# Patient Record
Sex: Female | Born: 1985 | Race: Black or African American | Hispanic: No | Marital: Single | Smoking: Former smoker
Health system: Southern US, Community
[De-identification: ages and names within clinical notes are randomized; demographics above are authoritative.]

## PROBLEM LIST (undated history)

## (undated) DIAGNOSIS — G35 Multiple sclerosis: Secondary | ICD-10-CM

## (undated) HISTORY — PX: VAGINAL HYSTERECTOMY: SUR661

## (undated) HISTORY — DX: Multiple sclerosis: G35

---
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
## 2006-02-07 ENCOUNTER — Inpatient Hospital Stay: Payer: Self-pay

## 2007-02-10 ENCOUNTER — Emergency Department: Payer: Self-pay | Admitting: Emergency Medicine

## 2007-04-08 ENCOUNTER — Emergency Department: Payer: Self-pay | Admitting: Emergency Medicine

## 2007-05-24 ENCOUNTER — Emergency Department: Payer: Self-pay | Admitting: Emergency Medicine

## 2007-10-01 ENCOUNTER — Emergency Department: Payer: Self-pay | Admitting: Emergency Medicine

## 2007-12-11 ENCOUNTER — Emergency Department: Payer: Self-pay | Admitting: Emergency Medicine

## 2008-02-07 ENCOUNTER — Emergency Department: Payer: Self-pay | Admitting: Emergency Medicine

## 2008-02-09 ENCOUNTER — Emergency Department: Payer: Self-pay | Admitting: Emergency Medicine

## 2010-01-01 ENCOUNTER — Emergency Department: Payer: Self-pay | Admitting: Emergency Medicine

## 2010-08-05 ENCOUNTER — Emergency Department: Payer: Self-pay | Admitting: Emergency Medicine

## 2011-03-19 ENCOUNTER — Emergency Department: Payer: Self-pay | Admitting: Unknown Physician Specialty

## 2011-06-02 ENCOUNTER — Emergency Department: Payer: Self-pay | Admitting: Emergency Medicine

## 2011-08-20 ENCOUNTER — Emergency Department: Payer: Self-pay | Admitting: Internal Medicine

## 2011-08-29 ENCOUNTER — Emergency Department: Payer: Self-pay | Admitting: Emergency Medicine

## 2011-11-15 ENCOUNTER — Observation Stay: Payer: Self-pay | Admitting: Obstetrics and Gynecology

## 2011-11-15 LAB — COMPREHENSIVE METABOLIC PANEL
Albumin: 3 g/dL — ABNORMAL LOW (ref 3.4–5.0)
Alkaline Phosphatase: 97 U/L (ref 50–136)
Anion Gap: 14 (ref 7–16)
BUN: 5 mg/dL — ABNORMAL LOW (ref 7–18)
Bilirubin,Total: 0.3 mg/dL (ref 0.2–1.0)
Calcium, Total: 8.9 mg/dL (ref 8.5–10.1)
Chloride: 103 mmol/L (ref 98–107)
Co2: 22 mmol/L (ref 21–32)
Creatinine: 0.43 mg/dL — ABNORMAL LOW (ref 0.60–1.30)
EGFR (African American): >60
EGFR (Non-African Amer.): >60
Glucose: 88 mg/dL (ref 65–99)
Osmolality: 274 (ref 275–301)
Potassium: 3.8 mmol/L (ref 3.5–5.1)
SGOT(AST): 19 U/L (ref 15–37)
SGPT (ALT): 30 U/L
Sodium: 139 mmol/L (ref 136–145)
Total Protein: 7.4 g/dL (ref 6.4–8.2)

## 2011-11-15 LAB — CBC WITH DIFFERENTIAL/PLATELET
Basophil #: 0 10*3/uL (ref 0.0–0.1)
Basophil %: 0.4 %
Eosinophil #: 0.2 10*3/uL (ref 0.0–0.7)
Eosinophil %: 2 %
HCT: 33.6 % — ABNORMAL LOW (ref 35.0–47.0)
HGB: 10.6 g/dL — ABNORMAL LOW (ref 12.0–16.0)
Lymphocyte #: 2.1 10*3/uL (ref 1.0–3.6)
Lymphocyte %: 21.7 %
MCH: 23.5 pg — ABNORMAL LOW (ref 26.0–34.0)
MCHC: 31.6 g/dL — ABNORMAL LOW (ref 32.0–36.0)
MCV: 75 fL — ABNORMAL LOW (ref 80–100)
Monocyte #: 0.8 10*3/uL — ABNORMAL HIGH (ref 0.0–0.7)
Monocyte %: 8 %
Neutrophil #: 6.6 10*3/uL — ABNORMAL HIGH (ref 1.4–6.5)
Neutrophil %: 67.9 %
Platelet: 252 10*3/uL (ref 150–440)
RBC: 4.51 10*6/uL (ref 3.80–5.20)
RDW: 15.6 % — ABNORMAL HIGH (ref 11.5–14.5)
WBC: 9.8 10*3/uL (ref 3.6–11.0)

## 2011-11-15 LAB — LIPASE, BLOOD: Lipase: 75 U/L (ref 73–393)

## 2011-12-31 ENCOUNTER — Ambulatory Visit: Payer: Self-pay | Admitting: Physical Therapy

## 2011-12-31 ENCOUNTER — Encounter: Payer: Self-pay | Admitting: Occupational Therapy

## 2012-01-02 ENCOUNTER — Ambulatory Visit: Payer: Medicaid Other | Attending: Physical Medicine and Rehabilitation | Admitting: Occupational Therapy

## 2012-01-02 ENCOUNTER — Ambulatory Visit: Payer: Medicaid Other | Admitting: Physical Therapy

## 2012-01-02 ENCOUNTER — Ambulatory Visit: Payer: Medicaid Other | Admitting: Speech Pathology

## 2012-01-02 DIAGNOSIS — M6281 Muscle weakness (generalized): Secondary | ICD-10-CM | POA: Insufficient documentation

## 2012-01-02 DIAGNOSIS — M2420 Disorder of ligament, unspecified site: Secondary | ICD-10-CM | POA: Insufficient documentation

## 2012-01-02 DIAGNOSIS — M629 Disorder of muscle, unspecified: Secondary | ICD-10-CM | POA: Insufficient documentation

## 2012-01-02 DIAGNOSIS — IMO0001 Reserved for inherently not codable concepts without codable children: Secondary | ICD-10-CM | POA: Insufficient documentation

## 2012-01-15 ENCOUNTER — Ambulatory Visit: Payer: Medicaid Other | Admitting: Occupational Therapy

## 2012-01-22 ENCOUNTER — Encounter: Payer: Medicaid Other | Admitting: Occupational Therapy

## 2012-01-29 ENCOUNTER — Encounter: Payer: Medicaid Other | Admitting: Occupational Therapy

## 2012-11-10 ENCOUNTER — Emergency Department: Payer: Self-pay | Admitting: Emergency Medicine

## 2012-11-10 LAB — BASIC METABOLIC PANEL
Anion Gap: 8 (ref 7–16)
BUN: 13 mg/dL (ref 7–18)
Calcium, Total: 8.9 mg/dL (ref 8.5–10.1)
Chloride: 107 mmol/L (ref 98–107)
Co2: 21 mmol/L (ref 21–32)
Creatinine: 0.47 mg/dL — ABNORMAL LOW (ref 0.60–1.30)
EGFR (African American): >60
EGFR (Non-African Amer.): >60
Glucose: 89 mg/dL (ref 65–99)
Osmolality: 272 (ref 275–301)
Potassium: 3.9 mmol/L (ref 3.5–5.1)
Sodium: 136 mmol/L (ref 136–145)

## 2012-11-10 LAB — URINALYSIS, COMPLETE
Bacteria: NONE SEEN
Bilirubin,UR: NEGATIVE
Blood: NEGATIVE
Glucose,UR: NEGATIVE mg/dL (ref 0–75)
Ketone: NEGATIVE
Nitrite: NEGATIVE
Ph: 7 (ref 4.5–8.0)
Protein: NEGATIVE
RBC,UR: 2 /HPF (ref 0–5)
Specific Gravity: 1.012 (ref 1.003–1.030)
Squamous Epithelial: 3
WBC UR: 1 /HPF (ref 0–5)

## 2012-11-10 LAB — CBC
HCT: 35.9 % (ref 35.0–47.0)
HGB: 10.9 g/dL — ABNORMAL LOW (ref 12.0–16.0)
MCH: 20.4 pg — ABNORMAL LOW (ref 26.0–34.0)
MCHC: 30.3 g/dL — ABNORMAL LOW (ref 32.0–36.0)
MCV: 67 fL — ABNORMAL LOW (ref 80–100)
Platelet: 413 10*3/uL (ref 150–440)
RBC: 5.33 10*6/uL — ABNORMAL HIGH (ref 3.80–5.20)
RDW: 17.8 % — ABNORMAL HIGH (ref 11.5–14.5)
WBC: 8.7 10*3/uL (ref 3.6–11.0)

## 2013-01-08 ENCOUNTER — Emergency Department: Payer: Self-pay | Admitting: Emergency Medicine

## 2013-01-08 LAB — COMPREHENSIVE METABOLIC PANEL
Albumin: 3.6 g/dL (ref 3.4–5.0)
Alkaline Phosphatase: 150 U/L — ABNORMAL HIGH (ref 50–136)
Anion Gap: 10 (ref 7–16)
BUN: 12 mg/dL (ref 7–18)
Bilirubin,Total: 0.5 mg/dL (ref 0.2–1.0)
Calcium, Total: 8.6 mg/dL (ref 8.5–10.1)
Chloride: 104 mmol/L (ref 98–107)
Co2: 19 mmol/L — ABNORMAL LOW (ref 21–32)
Creatinine: 0.66 mg/dL (ref 0.60–1.30)
EGFR (African American): >60
EGFR (Non-African Amer.): >60
Glucose: 76 mg/dL (ref 65–99)
Osmolality: 265 (ref 275–301)
Potassium: 3.3 mmol/L — ABNORMAL LOW (ref 3.5–5.1)
SGOT(AST): 16 U/L (ref 15–37)
SGPT (ALT): 18 U/L (ref 12–78)
Sodium: 133 mmol/L — ABNORMAL LOW (ref 136–145)
Total Protein: 8 g/dL (ref 6.4–8.2)

## 2013-01-08 LAB — CBC WITH DIFFERENTIAL/PLATELET
Basophil #: 0 10*3/uL (ref 0.0–0.1)
Basophil %: 0.5 %
Eosinophil #: 0.1 10*3/uL (ref 0.0–0.7)
Eosinophil %: 1 %
HCT: 34.8 % — ABNORMAL LOW (ref 35.0–47.0)
HGB: 11 g/dL — ABNORMAL LOW (ref 12.0–16.0)
Lymphocyte #: 1.8 10*3/uL (ref 1.0–3.6)
Lymphocyte %: 29.6 %
MCH: 21 pg — ABNORMAL LOW (ref 26.0–34.0)
MCHC: 31.8 g/dL — ABNORMAL LOW (ref 32.0–36.0)
MCV: 66 fL — ABNORMAL LOW (ref 80–100)
Monocyte #: 0.3 x10 3/mm (ref 0.2–0.9)
Monocyte %: 5.2 %
Neutrophil #: 3.9 10*3/uL (ref 1.4–6.5)
Neutrophil %: 63.7 %
Platelet: 366 10*3/uL (ref 150–440)
RBC: 5.26 10*6/uL — ABNORMAL HIGH (ref 3.80–5.20)
RDW: 18.1 % — ABNORMAL HIGH (ref 11.5–14.5)
WBC: 6 10*3/uL (ref 3.6–11.0)

## 2013-01-08 LAB — LIPASE, BLOOD: Lipase: 60 U/L — ABNORMAL LOW (ref 73–393)

## 2013-02-18 ENCOUNTER — Emergency Department: Payer: Self-pay | Admitting: Emergency Medicine

## 2015-03-07 NOTE — H&P (Signed)
L&D Evaluation:  History:   HPI 29 yo G3P1011 at [redacted]w[redacted]d by LMP=10w [place] derived EDC of 03/25/2012 presenting with LUQ pain starting this evening.  Patient has baked Ziti and cake that was pretty greasy for dinner.  She has had problems with heartburn this pregnancy. Pain has been constant since starting, only relief is from slplinting affected area.  She denies cought, trauma.  Pain does not radiate.    Presents with abdominal pain    Patient's Medical History Depression, being followed for polyhydramnios at [facility]? Had thickened NT but negative amnio.    Medications Pre Natal Vitamins    Allergies NKDA    Social History tobacco    Family History Non-Contributory   Exam:   Vital Signs stable    General no apparent distress    Mental Status clear    Abdomen gravid with fundus at level of umbilicus, fndus non-tender, soft.  Mild LUQ pain, but actually feels better when pushing in    Edema no edema    FHT +FHT   Impression:   Impression GERD   Plan:   Comments - GI cocktail with viscous lidocaine - If improvement in symptoms will provide with Rx for Zantac - Has follow up [facility] 2/13  Addendum 11:08 Patient with no improvement after GI cocktail, now tearful.  Patient states that she feel like pain has actually worsened also some lumbago.   - CBC c dif - CMP - Lipase Still think that if not GERD likely MSK related.  Pain is not near insertion site of amniocentesis 2 weeks ago.  Vena Austria, MD    Follow Up Appointment need to schedule. in 1 week   Electronic Signatures: Lorrene Reid (MD)  (Signed 18-Jan-13 23:11)  Authored: L&D Evaluation   Last Updated: 18-Jan-13 23:11 by Lorrene Reid (MD)

## 2016-06-10 DIAGNOSIS — E611 Iron deficiency: Secondary | ICD-10-CM | POA: Insufficient documentation

## 2016-06-10 DIAGNOSIS — E559 Vitamin D deficiency, unspecified: Secondary | ICD-10-CM | POA: Insufficient documentation

## 2016-08-28 DIAGNOSIS — Z9884 Bariatric surgery status: Secondary | ICD-10-CM | POA: Insufficient documentation

## 2016-10-28 HISTORY — PX: LAPAROSCOPIC GASTRIC SLEEVE RESECTION: SHX5895

## 2017-10-01 DIAGNOSIS — N879 Dysplasia of cervix uteri, unspecified: Secondary | ICD-10-CM | POA: Insufficient documentation

## 2018-06-21 ENCOUNTER — Ambulatory Visit
Admission: EM | Admit: 2018-06-21 | Discharge: 2018-06-21 | Disposition: A | Discharge status: Home / Self Care | Payer: BC Managed Care – PPO | Attending: Family Medicine | Admitting: Family Medicine

## 2018-06-21 DIAGNOSIS — G35 Multiple sclerosis: Secondary | ICD-10-CM | POA: Insufficient documentation

## 2018-06-21 DIAGNOSIS — H5789 Other specified disorders of eye and adnexa: Secondary | ICD-10-CM | POA: Diagnosis not present

## 2018-06-21 MED ORDER — KETOROLAC TROMETHAMINE 0.5 % OP SOLN: 1.0000 [drp] | Freq: Four times a day (QID) | OPHTHALMIC | 0 refills | Status: DC | PRN

## 2018-06-21 NOTE — ED Provider Notes (Signed)
[facility]    CSN: [id number] Arrival date & time: 06/21/18  1122  History   Chief Complaint Chief Complaint  Patient presents with  . Eye Problem   HPI  32 year old female presents with an eye problem.  Patient reports that approximately 2 hours prior to arrival here she developed redness, irritation, and mild pain of her right eye.  She took her contact out and the redness and irritation has improved.  However, she is concerned about a foreign body in her right eye.  She does not recall a foreign body getting into her eye.  No vision changes.  Her vision is stable currently.  No severe pain.  No photophobia.  No drainage.  No other associated symptoms.  No other complaints.  PMH, Surgical Hx, Family Hx, Social History reviewed and updated as below.  PMH: Multiple sclerosis, morbid obesity, iron deficiency  Surgical history: CESAREAN SECTION      PR LAP, GAST RESTRICT PROC, LONGITUDINAL GASTRECTOMY 08/28/2016 N/A Procedure: LAPAROSCOPY, SURGICAL, GASTRIC RESTRICTIVE PROCEDURE; LONGITUDINAL GASTRECTOMY; Surgeon: Felton Clinton, MD; Location: MAIN OR [facility]; Service: Gastrointestinal    Family Hx: Hypertension Father    Diabetes Maternal Grandmother    Hypertension Maternal Grandmother    Hypertension Mother    Heart disease Paternal Grandmother     Social History Social History   Tobacco Use  . Smoking status: Current Some Day Smoker  . Smokeless tobacco: Never Used  Substance Use Topics  . Alcohol use: Yes  . Drug use: Not on file   Allergies   Penicillins   Review of Systems Review of Systems  Constitutional: Negative.   Eyes: Positive for pain and redness. Negative for visual disturbance.   Physical Exam Triage Vital Signs ED Triage Vitals  Enc Vitals Group     BP 06/21/18 1135 (!) 157/100     Pulse Rate 06/21/18 1135 78     Resp 06/21/18 1135 18     Temp 06/21/18 1135 98.2 F (36.8 C)     Temp Source 06/21/18 1135 Oral       SpO2 06/21/18 1135 100 %     Weight 06/21/18 1137 213 lb (96.6 kg)     Height --      Head Circumference --      Peak Flow --      Pain Score 06/21/18 1136 0     Pain Loc --      Pain Edu? --      Excl. in GC? --    Updated Vital Signs BP (!) 157/100 (BP Location: Right Arm)   Pulse 78   Temp 98.2 F (36.8 C) (Oral)   Resp 18   Wt 96.6 kg   LMP 06/17/2018 (Exact Date)   SpO2 100%   Visual Acuity Right Eye Distance: 20/25 Left Eye Distance: 20/25 Bilateral Distance:    Right Eye Near:   Left Eye Near:    Bilateral Near:     Physical Exam  Constitutional: She is oriented to person, place, and time. She appears well-developed. No distress.  HENT:  Head: Normocephalic and atraumatic.  Eyes: Pupils are equal, round, and reactive to light. Conjunctivae are normal. Right eye exhibits no discharge. Left eye exhibits no discharge.  No fluorescein uptake.  No evidence of foreign body.  Pulmonary/Chest: Effort normal. No respiratory distress.  Neurological: She is alert and oriented to person, place, and time.  Psychiatric: She has a normal mood and affect. Her behavior is normal.  Nursing note  and vitals reviewed.  UC Treatments / Results  Labs (all labs ordered are listed, but only abnormal results are displayed) Labs Reviewed - No data to display  EKG None  Radiology No results found.  Procedures Procedures (including critical care time)  Medications Ordered in UC Medications - No data to display  Initial Impression / Assessment and Plan / UC Course  I have reviewed the triage vital signs and the nursing notes.  Pertinent labs & imaging results that were available during my care of the patient were reviewed by me and considered in my medical decision making (see chart for details).    32 year old female presents with eye irritation.  Her exam is unrevealing.  Advised to stop wearing contacts the next few days.  Ketorolac eyedrops as needed.  Supportive  care.  Final Clinical Impressions(s) / UC Diagnoses   Final diagnoses:  Eye irritation     Discharge Instructions     No contacts for a few days.  Use the eyedrops as needed.  Take care  Dr. Adriana Simas   ED Prescriptions    Medication Sig Dispense Auth. Provider   ketorolac (ACULAR) 0.5 % ophthalmic solution Place 1 drop into the right eye 4 (four) times daily as needed. 3 mL Tommie Sams, DO     Controlled Substance Prescriptions [place] Controlled Substance Registry consulted? Not Applicable   Tommie Sams, DO 06/21/18 1324

## 2018-06-21 NOTE — ED Triage Notes (Signed)
Pt states she feels as if something is in her right eye, possibly a piece of contact. Blurry vision and watery. Was red but states that went away. It started today.

## 2018-06-21 NOTE — Discharge Instructions (Signed)
No contacts for a few days.  Use the eyedrops as needed.  Take care  Dr. Adriana Simas

## 2018-10-30 ENCOUNTER — Other Ambulatory Visit: Payer: Self-pay

## 2018-10-30 ENCOUNTER — Ambulatory Visit
Admission: EM | Admit: 2018-10-30 | Discharge: 2018-10-30 | Disposition: A | Discharge status: Home / Self Care | Payer: BC Managed Care – PPO | Attending: Physician Assistant | Admitting: Physician Assistant

## 2018-10-30 DIAGNOSIS — J020 Streptococcal pharyngitis: Secondary | ICD-10-CM

## 2018-10-30 LAB — RAPID STREP SCREEN (MED CTR MEBANE ONLY): STREPTOCOCCUS, GROUP A SCREEN (DIRECT): POSITIVE — AB

## 2018-10-30 MED ORDER — AZITHROMYCIN 500 MG PO TABS: 500.0000 mg | ORAL_TABLET | Freq: Every day | ORAL | 0 refills | Status: AC

## 2018-10-30 NOTE — ED Triage Notes (Signed)
Patient states that symptoms started on Sunday with what feels like she had something in her throat. Patient states that symptoms have been worsening with painful swallowing and pain in right ear.

## 2018-10-30 NOTE — ED Provider Notes (Signed)
[facility]    CSN: [id number] Arrival date & time: 10/30/18  0930     History   Chief Complaint Chief Complaint  Patient presents with  . Sore Throat    APPT    HPI Jamie Meyers is a 33 y.o. female.   Patient is a 33 year old female who presents complaint of sore throat since Sunday (today is Friday).  Patient denies any fever, chills, shortness of breath, chest pain, abdominal pain, nausea, vomiting.  Patient does report some pain to her right ear with swallowing.  She also reports some nasal congestion but that is normal for her.  Patient reports allergies to penicillin.     History reviewed. No pertinent past medical history.  Patient Active Problem List   Diagnosis Date Noted  . Multiple sclerosis (HCC) 06/21/2018  . 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 Morbid obesity due to excess calories (HCC) 08/05/2016  . Iron deficiency 06/10/2016  . Vitamin D deficiency 06/10/2016    Past Surgical History:  Procedure Laterality Date  . CESAREAN SECTION  2013  . LAPAROSCOPIC GASTRIC SLEEVE RESECTION  2018    OB History   No obstetric history on file.      Home Medications    Prior to Admission medications   Medication Sig Start Date End Date Taking? Authorizing Provider  TRI-SPRINTEC 0.18/0.215/0.25 MG-35 MCG tablet Take 1 tablet by mouth daily. 05/01/18  Yes [provider]  azithromycin (ZITHROMAX) 500 MG tablet Take 1 tablet (500 mg total) by mouth daily for 5 days. 10/30/18 11/04/18  Candis Schatz, PA-C    Family History Family History  Problem Relation Age of Onset  . Hypertension Mother   . Hypertension Father   . CVA Father   . Heart attack Father     Social History Social History   Tobacco Use  . Smoking status: Former Games developer  . Smokeless tobacco: Never Used  Substance Use Topics  . Alcohol use: Yes    Comment: occasionally  . Drug use: Not Currently     Allergies     Penicillins   Review of Systems Review of Systems as noted above in HPI.  Other systems reviewed and found to be negative.   Physical Exam Triage Vital Signs ED Triage Vitals  Enc Vitals Group     BP 10/30/18 0943 (!) 130/92     Pulse Rate 10/30/18 0943 83     Resp 10/30/18 0943 18     Temp 10/30/18 0943 98.7 F (37.1 C)     Temp Source 10/30/18 0943 Oral     SpO2 10/30/18 0943 100 %     Weight 10/30/18 0939 215 lb (97.5 kg)     Height 10/30/18 0939 5\' 5"  (1.651 m)     Head Circumference --      Peak Flow --      Pain Score 10/30/18 0938 7     Pain Loc --      Pain Edu? --      Excl. in GC? --    No data found.  Updated Vital Signs BP (!) 130/92 (BP Location: Left Arm)   Pulse 83   Temp 98.7 F (37.1 C) (Oral)   Resp 18   Ht 5\' 5"  (1.651 m)   Wt 215 lb (97.5 kg)   LMP 10/04/2018   SpO2 100%   BMI 35.78 kg/m    Physical Exam Constitutional:      Appearance: She  is well-developed. She is not ill-appearing.  HENT:     Right Ear: A middle ear effusion is present.     Left Ear: A middle ear effusion is present.     Nose: Congestion present.     Mouth/Throat:     Mouth: Mucous membranes are moist.     Pharynx: Posterior oropharyngeal erythema present.     Tonsils: Swelling: 1+ on the right. 1+ on the left.  Eyes:     Conjunctiva/sclera: Conjunctivae normal.     Pupils: Pupils are equal, round, and reactive to light.  Neck:     Musculoskeletal: Normal range of motion and neck supple.  Cardiovascular:     Rate and Rhythm: Normal rate and regular rhythm.     Heart sounds: Normal heart sounds. No murmur. No gallop.   Pulmonary:     Effort: Pulmonary effort is normal.     Breath sounds: Normal breath sounds.  Abdominal:     General: Bowel sounds are normal.     Palpations: Abdomen is soft.  Skin:    General: Skin is warm and dry.     Capillary Refill: Capillary refill takes less than 2 seconds.  Neurological:     General: No focal deficit present.      Mental Status: She is alert and oriented to person, place, and time.  Psychiatric:        Mood and Affect: Mood normal.        Behavior: Behavior normal.      UC Treatments / Results  Labs (all labs ordered are listed, but only abnormal results are displayed) Labs Reviewed  RAPID STREP SCREEN ([facility] ONLY) - Abnormal; Notable for the following components:      Result Value   Streptococcus, Group A Screen (Direct) POSITIVE (*)    All other components within normal limits    EKG None  Radiology No results found.  Procedures Procedures (including critical care time)  Medications Ordered in UC Medications - No data to display  Initial Impression / Assessment and Plan / UC Course  I have reviewed the triage vital signs and the nursing notes.  Pertinent labs & imaging results that were available during my care of the patient were reviewed by me and considered in my medical decision making (see chart for details).     Patient with reported sore throat for about 4 days.  Fluid to both ears.  Rapid strep was positive.  Patient given prescription for azithromycin, 500 mg x 5 days due to her penicillin allergy.  Patient given instructions as noted below.  Patient verbalized understanding is agreement with plan.  Final Clinical Impressions(s) / UC Diagnoses   Final diagnoses:  Strep throat     Discharge Instructions     -Azithromycin: one tablet daily for 5 days -ibuprofen and Tylenol as needed for pain and fever -sore throat spray or lozenge as needed -follow up with PCP if no improvement   ED Prescriptions    Medication Sig Dispense Auth. Provider   azithromycin (ZITHROMAX) 500 MG tablet Take 1 tablet (500 mg total) by mouth daily for 5 days. 5 tablet Candis SchatzHarris, Marlaya Turck D, PA-C     Controlled Substance Prescriptions [place] Controlled Substance Registry consulted? Not Applicable   Candis SchatzHarris, Steward Sames D, PA-C 10/30/18 1628

## 2018-10-30 NOTE — Discharge Instructions (Signed)
-  Azithromycin: one tablet daily for 5 days -ibuprofen and Tylenol as needed for pain and fever -sore throat spray or lozenge as needed -follow up with PCP if no improvement

## 2020-01-10 ENCOUNTER — Emergency Department: Payer: BC Managed Care – PPO

## 2020-01-10 ENCOUNTER — Emergency Department
Admission: EM | Admit: 2020-01-10 | Discharge: 2020-01-10 | Disposition: A | Discharge status: Home / Self Care | Payer: BC Managed Care – PPO | Attending: Emergency Medicine | Admitting: Emergency Medicine

## 2020-01-10 ENCOUNTER — Other Ambulatory Visit: Payer: Self-pay

## 2020-01-10 ENCOUNTER — Encounter: Payer: Self-pay | Admitting: Emergency Medicine

## 2020-01-10 DIAGNOSIS — R0789 Other chest pain: Secondary | ICD-10-CM | POA: Insufficient documentation

## 2020-01-10 DIAGNOSIS — R0602 Shortness of breath: Secondary | ICD-10-CM | POA: Insufficient documentation

## 2020-01-10 DIAGNOSIS — Y9389 Activity, other specified: Secondary | ICD-10-CM | POA: Diagnosis not present

## 2020-01-10 DIAGNOSIS — S161XXA Strain of muscle, fascia and tendon at neck level, initial encounter: Secondary | ICD-10-CM | POA: Insufficient documentation

## 2020-01-10 DIAGNOSIS — Y929 Unspecified place or not applicable: Secondary | ICD-10-CM | POA: Insufficient documentation

## 2020-01-10 DIAGNOSIS — Z87891 Personal history of nicotine dependence: Secondary | ICD-10-CM | POA: Insufficient documentation

## 2020-01-10 DIAGNOSIS — X500XXA Overexertion from strenuous movement or load, initial encounter: Secondary | ICD-10-CM | POA: Insufficient documentation

## 2020-01-10 DIAGNOSIS — Z793 Long term (current) use of hormonal contraceptives: Secondary | ICD-10-CM | POA: Diagnosis not present

## 2020-01-10 DIAGNOSIS — Y999 Unspecified external cause status: Secondary | ICD-10-CM | POA: Insufficient documentation

## 2020-01-10 DIAGNOSIS — S199XXA Unspecified injury of neck, initial encounter: Secondary | ICD-10-CM | POA: Diagnosis present

## 2020-01-10 DIAGNOSIS — M542 Cervicalgia: Secondary | ICD-10-CM

## 2020-01-10 LAB — BASIC METABOLIC PANEL
Anion gap: 10 (ref 5–15)
BUN: 12 mg/dL (ref 6–20)
CO2: 22 mmol/L (ref 22–32)
Calcium: 9.1 mg/dL (ref 8.9–10.3)
Chloride: 104 mmol/L (ref 98–111)
Creatinine, Ser: 0.58 mg/dL (ref 0.44–1.00)
GFR calc Af Amer: >60 mL/min (ref >60)
GFR calc non Af Amer: >60 mL/min (ref >60)
Glucose, Bld: 97 mg/dL (ref 70–99)
Potassium: 3.7 mmol/L (ref 3.5–5.1)
Sodium: 136 mmol/L (ref 135–145)

## 2020-01-10 LAB — CBC
HCT: 40 % (ref 36.0–46.0)
Hemoglobin: 12.1 g/dL (ref 12.0–15.0)
MCH: 22.4 pg — ABNORMAL LOW (ref 26.0–34.0)
MCHC: 30.3 g/dL (ref 30.0–36.0)
MCV: 74.1 fL — ABNORMAL LOW (ref 80.0–100.0)
Platelets: 365 10*3/uL (ref 150–400)
RBC: 5.4 MIL/uL — ABNORMAL HIGH (ref 3.87–5.11)
RDW: 17.2 % — ABNORMAL HIGH (ref 11.5–15.5)
WBC: 8.7 10*3/uL (ref 4.0–10.5)
nRBC: 0 % (ref 0.0–0.2)

## 2020-01-10 LAB — TROPONIN I (HIGH SENSITIVITY)
Troponin I (High Sensitivity): <2 ng/L (ref <18)
Troponin I (High Sensitivity): <2 ng/L (ref <18)

## 2020-01-10 MED ORDER — IOHEXOL 350 MG/ML SOLN
75.0000 mL | Freq: Once | INTRAVENOUS | Status: AC | PRN
  Administered 2020-01-10: 75 mL via INTRAVENOUS

## 2020-01-10 MED ORDER — ONDANSETRON HCL 4 MG/2ML IJ SOLN
4.0000 mg | Freq: Once | INTRAMUSCULAR | Status: AC
  Administered 2020-01-10: 4 mg via INTRAVENOUS
  Filled 2020-01-10: qty 2

## 2020-01-10 MED ORDER — MORPHINE SULFATE (PF) 4 MG/ML IV SOLN
6.0000 mg | Freq: Once | INTRAVENOUS | Status: AC
  Administered 2020-01-10: 6 mg via INTRAVENOUS
  Filled 2020-01-10: qty 2

## 2020-01-10 MED ORDER — KETOROLAC TROMETHAMINE 30 MG/ML IJ SOLN
15.0000 mg | Freq: Once | INTRAMUSCULAR | Status: AC
  Administered 2020-01-10: 15 mg via INTRAVENOUS
  Filled 2020-01-10: qty 1

## 2020-01-10 MED ORDER — NAPROXEN 375 MG PO TABS: 375.0000 mg | ORAL_TABLET | Freq: Two times a day (BID) | ORAL | 0 refills | Status: AC

## 2020-01-10 MED ORDER — DEXAMETHASONE SODIUM PHOSPHATE 10 MG/ML IJ SOLN
10.0000 mg | Freq: Once | INTRAMUSCULAR | Status: AC
  Administered 2020-01-10: 10 mg via INTRAVENOUS
  Filled 2020-01-10: qty 1

## 2020-01-10 MED ORDER — HYDROCODONE-ACETAMINOPHEN 5-325 MG PO TABS: 1.0000 | ORAL_TABLET | Freq: Four times a day (QID) | ORAL | 0 refills | Status: AC | PRN

## 2020-01-10 NOTE — ED Triage Notes (Signed)
Pt arrival via [facility] from home via POV. Pt states she left urgent care and they sent her here for high BP.   Pt states two weeks ago she had a shouting match with someone and that she woke up the next day with her throat sore. Pt states 'I felt like I had had been choked' but that it went away and now there is a throbbing in her throat that has led to chest pain and shob.   Pt states she hasn't had any fevers, cough, weakness, n/v/d, or other symptoms.  Pt a&ox4

## 2020-01-10 NOTE — ED Notes (Addendum)
Pt reports "throbbing" pain from R side of neck down into R side of chest. Reports SOB at rest. Resp currently regular/unlabored. Denies fever/cough/N/V/D/difficulty swallowing. Currently NSR on monitor at 79.

## 2020-01-10 NOTE — ED Notes (Signed)
EDP Isaacs at bedside 

## 2020-01-10 NOTE — ED Notes (Signed)
Pt given crackers and water to PO challenge per EDP Isaacs verbal order.

## 2020-01-10 NOTE — ED Notes (Signed)
Pt urinated. Verbal from EDP Isaacs that pt does not need urine preg.

## 2020-01-10 NOTE — ED Provider Notes (Signed)
[facility] Emergency Department Provider Note  ____________________________________________   First MD Initiated Contact with Patient 01/10/20 1523     (approximate)  I have reviewed the triage vital signs and the nursing notes.   HISTORY  Chief Complaint Chest Pain and Shortness of Breath    HPI Jamie Meyers is a 34 y.o. female  Here with neck pain. Pt reports that she was involved in a loud shouting episode last week. Several days later, she began to develop a sharp, stabbing sensation in her right lower neck that is worse with movement, swallowing, and yelling. She's had some radiation of this down toward her chest. No fever, chills. No cough or SOB. No trauma to the neck. No associated HA, weakness, numbness, or visual changes. No other complaints. No specific alleviating factors.       History reviewed. No pertinent past medical history.  Patient Active Problem List   Diagnosis Date Noted  . Multiple sclerosis (HCC) 06/21/2018  . 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 Morbid obesity due to excess calories (HCC) 08/05/2016  . Iron deficiency 06/10/2016  . Vitamin D deficiency 06/10/2016    Past Surgical History:  Procedure Laterality Date  . CESAREAN SECTION  2013  . LAPAROSCOPIC GASTRIC SLEEVE RESECTION  2018    Prior to Admission medications   Medication Sig Start Date End Date Taking? Authorizing Provider  HYDROcodone-acetaminophen (NORCO/VICODIN) 5-325 MG tablet Take 1-2 tablets by mouth every 6 (six) hours as needed for moderate pain or severe pain. 01/10/20 01/09/21  Shaune Pollack, MD  naproxen (NAPROSYN) 375 MG tablet Take 1 tablet (375 mg total) by mouth 2 (two) times daily with a meal for 5 days. 01/10/20 01/15/20  Shaune Pollack, MD  TRI-SPRINTEC 0.18/0.215/0.25 MG-35 MCG tablet Take 1 tablet by mouth daily. 05/01/18   [provider]    Allergies Penicillins  Family  History  Problem Relation Age of Onset  . Hypertension Mother   . Hypertension Father   . CVA Father   . Heart attack Father     Social History Social History   Tobacco Use  . Smoking status: Former Games developer  . Smokeless tobacco: Never Used  Substance Use Topics  . Alcohol use: Yes    Comment: occasionally  . Drug use: Not Currently    Review of Systems  Review of Systems  Constitutional: Negative for fatigue and fever.  HENT: Positive for sore throat and trouble swallowing. Negative for congestion.   Eyes: Negative for visual disturbance.  Respiratory: Negative for cough and shortness of breath.   Cardiovascular: Negative for chest pain.  Gastrointestinal: Negative for abdominal pain, diarrhea, nausea and vomiting.  Genitourinary: Negative for flank pain.  Musculoskeletal: Positive for neck pain. Negative for back pain.  Skin: Negative for rash and wound.  Neurological: Negative for weakness.  All other systems reviewed and are negative.    ____________________________________________  PHYSICAL EXAM:      VITAL SIGNS: ED Triage Vitals  Enc Vitals Group     BP 01/10/20 1330 (!) 148/86     Pulse Rate 01/10/20 1330 87     Resp 01/10/20 1330 18     Temp 01/10/20 1330 98.4 F (36.9 C)     Temp src --      SpO2 01/10/20 1330 100 %     Weight 01/10/20 1328 241 lb (109.3 kg)     Height 01/10/20 1328 5\' 5"  (1.651 m)  Head Circumference --      Peak Flow --      Pain Score 01/10/20 1328 8     Pain Loc --      Pain Edu? --      Excl. in [place]? --      Physical Exam Vitals and nursing note reviewed.  Constitutional:      General: She is not in acute distress.    Appearance: She is well-developed.  HENT:     Head: Normocephalic and atraumatic.  Eyes:     Conjunctiva/sclera: Conjunctivae normal.  Neck:     Comments: Moderate TTP over base of right anterior neck, overlying carotid and jugular, without apparent crepitance, deformity, bruit, hematoma, or  appreciable abnormality. No LAD. Neck ROM is full and painless. No hoarseness. Cardiovascular:     Rate and Rhythm: Normal rate and regular rhythm.     Heart sounds: Normal heart sounds. No murmur. No friction rub.  Pulmonary:     Effort: Pulmonary effort is normal. No respiratory distress.     Breath sounds: Normal breath sounds. No wheezing or rales.  Abdominal:     General: There is no distension.     Palpations: Abdomen is soft.     Tenderness: There is no abdominal tenderness.  Musculoskeletal:     Cervical back: Neck supple.  Skin:    General: Skin is warm.     Capillary Refill: Capillary refill takes less than 2 seconds.  Neurological:     Mental Status: She is alert and oriented to person, place, and time.     Motor: No abnormal muscle tone.       ____________________________________________   LABS (all labs ordered are listed, but only abnormal results are displayed)  Labs Reviewed  CBC - Abnormal; Notable for the following components:      Result Value   RBC 5.40 (*)    MCV 74.1 (*)    MCH 22.4 (*)    RDW 17.2 (*)    All other components within normal limits  BASIC METABOLIC PANEL  TROPONIN I (HIGH SENSITIVITY)  TROPONIN I (HIGH SENSITIVITY)    ____________________________________________  EKG: Normal SR, VR 90. ________________________________________  RADIOLOGY All imaging, including plain films, CT scans, and ultrasounds, independently reviewed by me, and interpretations confirmed via formal radiology reads.  ED MD interpretation:   CXR: Clear CTA Neck: no acute abnormality, arteries without abnormality  Official radiology report(s): DG Chest 2 View  Result Date: 01/10/2020 CLINICAL DATA:  Chest pain EXAM: CHEST - 2 VIEW COMPARISON:  April 09, 2007. FINDINGS: The lungs are clear. The heart size and pulmonary vascularity are normal. No adenopathy. No pneumothorax. No bone lesions. IMPRESSION: No abnormality noted. Electronically Signed   By: Lowella Grip III M.D.   On: 01/10/2020 14:33   CT Angio Neck W and/or Wo Contrast  Result Date: 01/10/2020 CLINICAL DATA:  Neck trauma, arterial injury suspected. Additional history provided: Right chest pain and right neck pain after a "screaming match" 2 weeks ago, history of hypertension. EXAM: CT ANGIOGRAPHY NECK TECHNIQUE: Multidetector CT imaging of the neck was performed using the standard protocol during bolus administration of intravenous contrast. Multiplanar CT image reconstructions and MIPs were obtained to evaluate the vascular anatomy. Carotid stenosis measurements (when applicable) are obtained utilizing NASCET criteria, using the distal internal carotid diameter as the denominator. CONTRAST:  65mL OMNIPAQUE IOHEXOL 350 MG/ML SOLN COMPARISON:  No pertinent prior studies available for comparison. FINDINGS: Aortic arch: Standard aortic branching. The  visualized portions of the aortic arch are unremarkable. No innominate or proximal subclavian artery stenosis. Right carotid system: CCA and ICA smooth and patent within the neck without stenosis. Left carotid system: CCA and ICA smooth and patent within the neck without stenosis. Vertebral arteries: The right vertebral artery is non-dominant and developmentally diminutive but patent throughout the neck. The dominant left vertebral artery is patent throughout the neck without evidence of dissection or stenosis. Skeleton: Nonspecific reversal of the expected cervical lordosis. No acute bony abnormality or aggressive osseous lesion. Other neck: No neck mass or cervical lymphadenopathy. Upper chest: No consolidation with imaged lung apices. No visible pneumothorax. IMPRESSION: The bilateral common carotid, internal carotid and vertebral arteries are patent within the neck without significant stenosis. No evidence of dissection. Nonspecific reversal of the expected cervical lordosis. Electronically Signed   By: Jackey Loge DO   On: 01/10/2020 16:10     ____________________________________________  PROCEDURES   Procedure(s) performed (including Critical Care):  Procedures  ____________________________________________  INITIAL IMPRESSION / MDM / ASSESSMENT AND PLAN / ED COURSE  As part of my medical decision making, I reviewed the following data within the electronic MEDICAL RECORD NUMBER Nursing notes reviewed and incorporated, Old chart reviewed, Notes from prior ED visits, and [place] Controlled Substance Database       *Jamie Meyers was evaluated in Emergency Department on 01/10/2020 for the symptoms described in the history of present illness. She was evaluated in the context of the global COVID-19 pandemic, which necessitated consideration that the patient might be at risk for infection with the SARS-CoV-2 virus that causes COVID-19. Institutional protocols and algorithms that pertain to the evaluation of patients at risk for COVID-19 are in a state of rapid change based on information released by regulatory bodies including the CDC and federal and state organizations. These policies and algorithms were followed during the patient's care in the ED.  Some ED evaluations and interventions may be delayed as a result of limited staffing during the pandemic.*     Medical Decision Making:  34 yo F here with neck pain after yelling forcefully. Suspect mild laryngeal or vocal cord edema/inflammation vs cervical strain from yelling. CT Angio obtained to evaluate for dissection, pneumomediastinum or other injury and is unremarkable. CXR is clear without PTX or subQ air. Pt tolerating PO without signs of pharyngeal or esophageal injury. Will treat with analgesics, antiinflammatories. Tolerating PO without difficulty and feels much improved after meds.  ____________________________________________  FINAL CLINICAL IMPRESSION(S) / ED DIAGNOSES  Final diagnoses:  Strain of neck muscle, initial encounter  Neck pain     MEDICATIONS GIVEN DURING  THIS VISIT:  Medications  iohexol (OMNIPAQUE) 350 MG/ML injection 75 mL (75 mLs Intravenous Contrast Given 01/10/20 1552)  morphine 4 MG/ML injection 6 mg (6 mg Intravenous Given 01/10/20 1705)  ondansetron (ZOFRAN) injection 4 mg (4 mg Intravenous Given 01/10/20 1702)  ketorolac (TORADOL) 30 MG/ML injection 15 mg (15 mg Intravenous Given 01/10/20 1704)  dexamethasone (DECADRON) injection 10 mg (10 mg Intravenous Given 01/10/20 1842)     ED Discharge Orders         Ordered    naproxen (NAPROSYN) 375 MG tablet  2 times daily with meals     01/10/20 1828    HYDROcodone-acetaminophen (NORCO/VICODIN) 5-325 MG tablet  Every 6 hours PRN     01/10/20 1828           Note:  This document was prepared using Dragon voice recognition software and may include  unintentional dictation errors.   Shaune Pollack, MD 01/10/20 [phone]

## 2020-01-10 NOTE — ED Notes (Signed)
Green, blue, red, and purple tubes sent to lab.

## 2020-01-10 NOTE — Discharge Instructions (Signed)
Be very careful NOT to raise your voice for the next week  No heavy lifting >10 lb  Take the medications as prescribed  Try gentle stretching exercises once your neck pain improves over the next 24-48 hours

## 2020-10-29 IMAGING — CR DG CHEST 2V
1 series · 2 of 2 positions shown · non-contrast
Comparison: April 09, 2007.

CLINICAL DATA: Chest pain

EXAM:
CHEST - 2 VIEW

[Series 1: dg chest 2 view · 0.14mm/px · 2 of 2 slices shown]
[im 1/2]
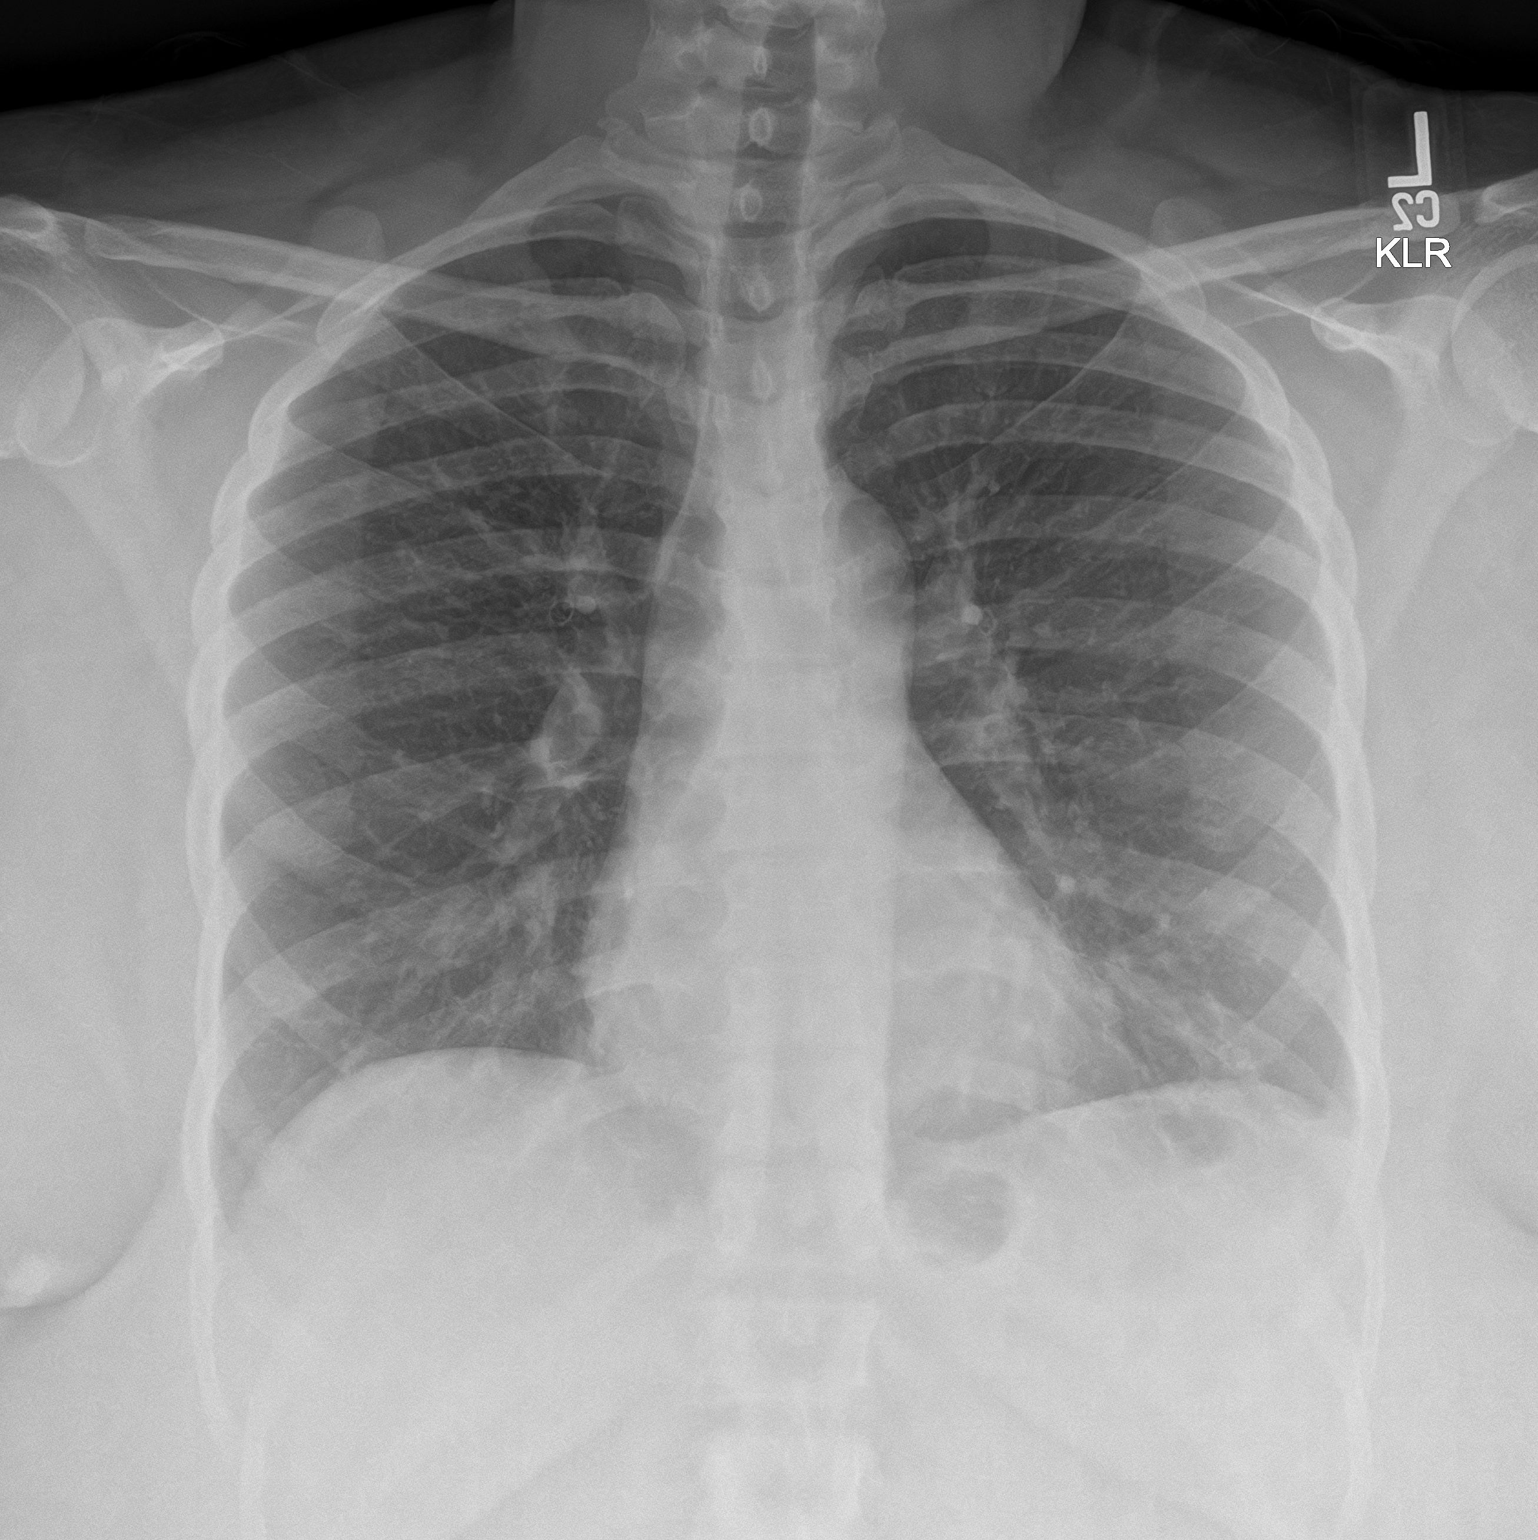
[im 2/2]
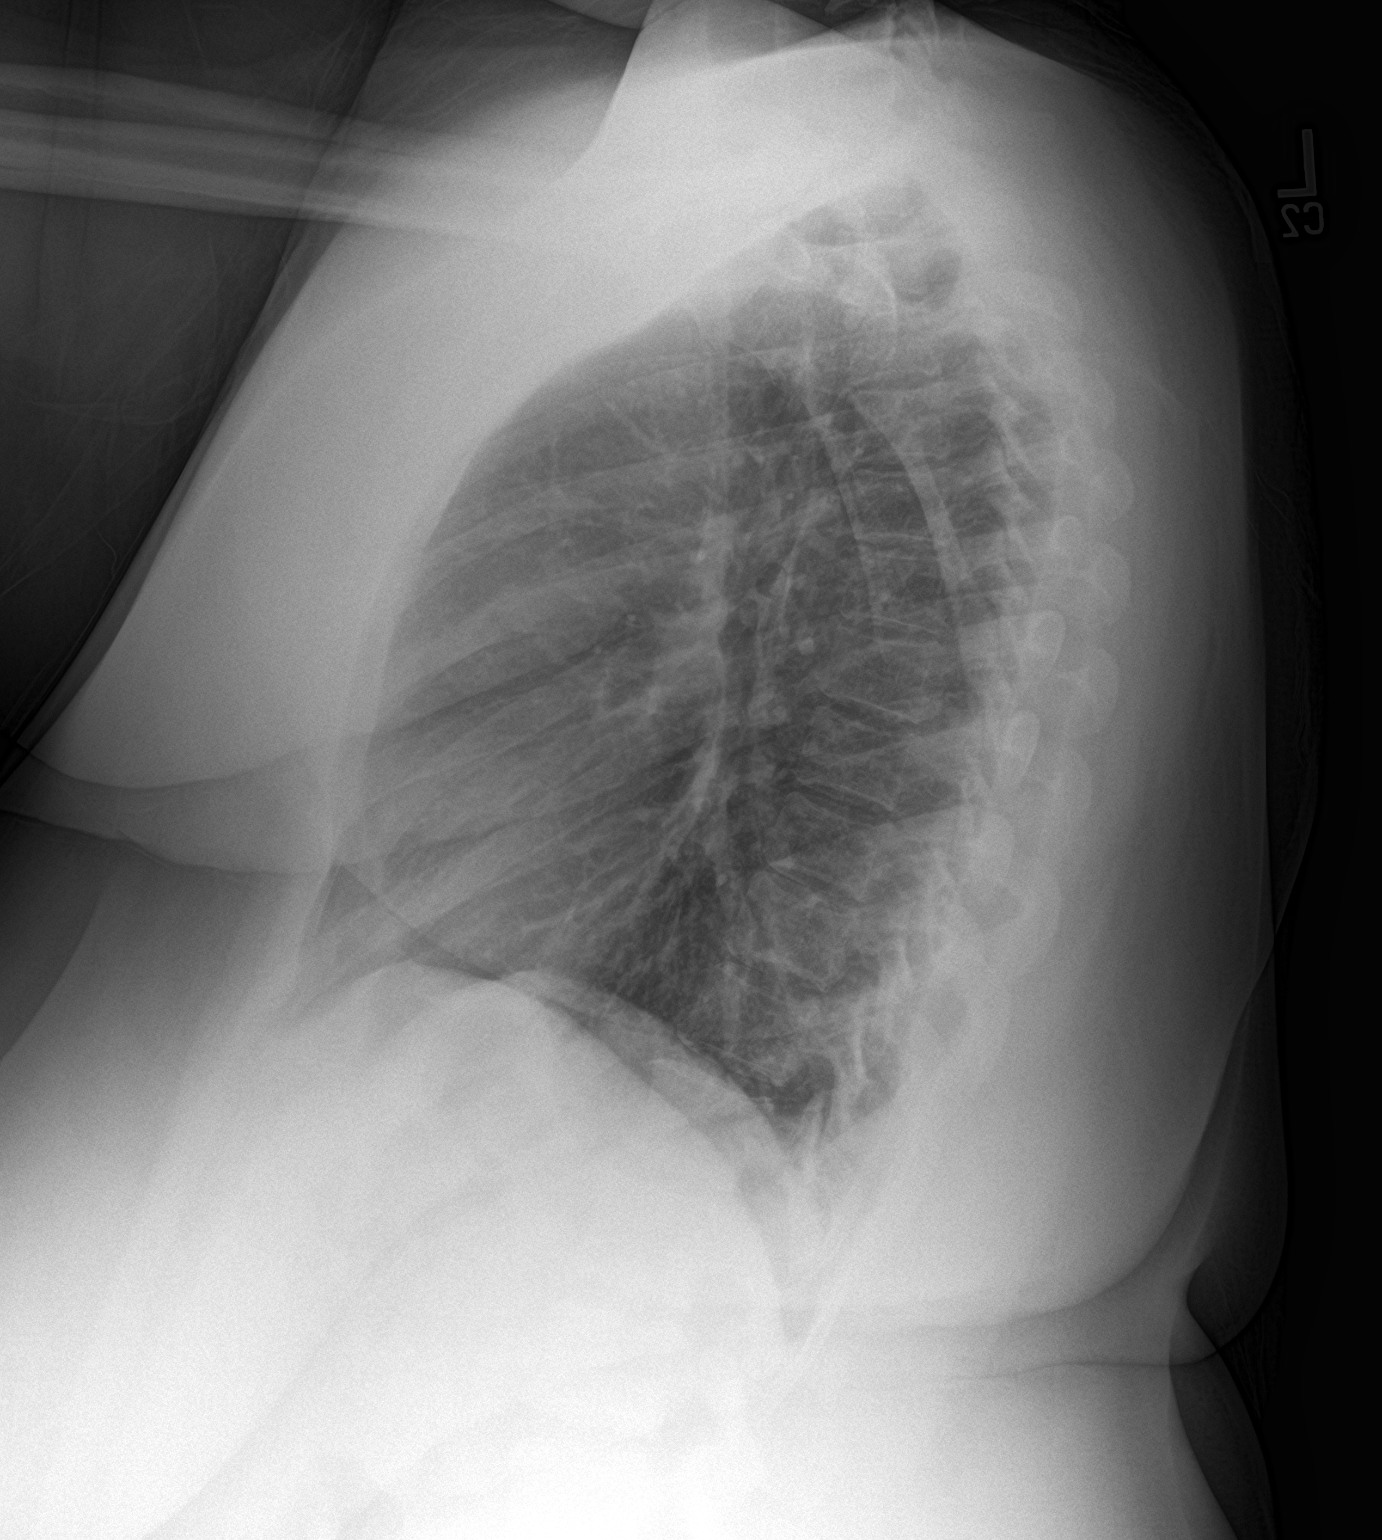

[2 of 2 positions shown; findings below may reference images not displayed]

FINDINGS: The lungs are clear. The heart size and pulmonary vascularity are
normal. No adenopathy. No pneumothorax. No bone lesions.
IMPRESSION: No abnormality noted.

## 2023-04-14 ENCOUNTER — Ambulatory Visit
Admission: EM | Admit: 2023-04-14 | Discharge: 2023-04-14 | Disposition: A | Discharge status: Home / Self Care | Payer: BC Managed Care – PPO | Attending: Internal Medicine | Admitting: Internal Medicine

## 2023-04-14 DIAGNOSIS — R300 Dysuria: Secondary | ICD-10-CM | POA: Insufficient documentation

## 2023-04-14 DIAGNOSIS — R200 Anesthesia of skin: Secondary | ICD-10-CM | POA: Insufficient documentation

## 2023-04-14 LAB — COMPREHENSIVE METABOLIC PANEL
ALT: 13 U/L (ref 0–44)
AST: 16 U/L (ref 15–41)
Albumin: 4.4 g/dL (ref 3.5–5.0)
Alkaline Phosphatase: 74 U/L (ref 38–126)
Anion gap: 4 — ABNORMAL LOW (ref 5–15)
BUN: 16 mg/dL (ref 6–20)
CO2: 23 mmol/L (ref 22–32)
Calcium: 8.7 mg/dL — ABNORMAL LOW (ref 8.9–10.3)
Chloride: 106 mmol/L (ref 98–111)
Creatinine, Ser: 0.59 mg/dL (ref 0.44–1.00)
GFR, Estimated: >60 mL/min (ref >60)
Glucose, Bld: 88 mg/dL (ref 70–99)
Potassium: 3.9 mmol/L (ref 3.5–5.1)
Sodium: 133 mmol/L — ABNORMAL LOW (ref 135–145)
Total Bilirubin: 0.4 mg/dL (ref 0.3–1.2)
Total Protein: 7.3 g/dL (ref 6.5–8.1)

## 2023-04-14 LAB — URINALYSIS, W/ REFLEX TO CULTURE (INFECTION SUSPECTED)
Glucose, UA: NEGATIVE mg/dL
Leukocytes,Ua: NEGATIVE
Nitrite: NEGATIVE
Protein, ur: NEGATIVE mg/dL
Specific Gravity, Urine: >1.03 — ABNORMAL HIGH (ref 1.005–1.030)
pH: 5.5 (ref 5.0–8.0)

## 2023-04-14 LAB — CBC WITH DIFFERENTIAL/PLATELET
Abs Immature Granulocytes: 0.01 10*3/uL (ref 0.00–0.07)
Basophils Absolute: 0.1 10*3/uL (ref 0.0–0.1)
Basophils Relative: 1 %
Eosinophils Absolute: 0.4 10*3/uL (ref 0.0–0.5)
Eosinophils Relative: 6 %
HCT: 40.5 % (ref 36.0–46.0)
Hemoglobin: 12.9 g/dL (ref 12.0–15.0)
Immature Granulocytes: 0 %
Lymphocytes Relative: 47 %
Lymphs Abs: 3.2 10*3/uL (ref 0.7–4.0)
MCH: 24.2 pg — ABNORMAL LOW (ref 26.0–34.0)
MCHC: 31.9 g/dL (ref 30.0–36.0)
MCV: 76.1 fL — ABNORMAL LOW (ref 80.0–100.0)
Monocytes Absolute: 0.4 10*3/uL (ref 0.1–1.0)
Monocytes Relative: 6 %
Neutro Abs: 2.6 10*3/uL (ref 1.7–7.7)
Neutrophils Relative %: 40 %
Platelets: 337 10*3/uL (ref 150–400)
RBC: 5.32 MIL/uL — ABNORMAL HIGH (ref 3.87–5.11)
RDW: 15.2 % (ref 11.5–15.5)
WBC: 6.6 10*3/uL (ref 4.0–10.5)
nRBC: 0 % (ref 0.0–0.2)

## 2023-04-14 LAB — TSH: TSH: 1.423 u[IU]/mL (ref 0.350–4.500)

## 2023-04-14 LAB — GLUCOSE, CAPILLARY: Glucose-Capillary: 86 mg/dL (ref 70–99)

## 2023-04-14 NOTE — ED Provider Notes (Signed)
[facility]    CSN: [id number] Arrival date & time: 04/14/23  1250      History   Chief Complaint Chief Complaint  Patient presents with   Foot Injury    Toe numbness - Entered by patient    HPI Jamie Meyers is a 37 y.o. female comes to the urgent care with numbness in both great toes over the past couple of weeks.  Patient symptoms started insidiously and has been persistent.  Patient denies any trauma to both feet.  No swelling or discoloration of the toes.  No recent fall.  Patient also complains of lower abdominal pressure with dysuria but denies any urgency or frequency.  She describes increased thirst and polyuria.  Patient recently lost her son in a car accident and has been battling with grief.  She has been drinking while on the almost daily basis to help with sadness.  She denies any shakes.  No nausea, vomiting or diarrhea.  She feels fatigued and denies any significant weight changes.  Patient denies any vaginal discharge.  HPI  History reviewed. No pertinent past medical history.  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      Home Medications    Prior to Admission medications   Medication Sig Start Date End Date Taking? Authorizing Provider  TRI-SPRINTEC 0.18/0.215/0.25 MG-35 MCG tablet Take 1 tablet by mouth daily. 05/01/18   [provider]    Family History Family History  Problem Relation Age of Onset   Hypertension Mother    Hypertension Father    CVA Father    Heart attack Father     Social  History Social History   Tobacco Use   Smoking status: Former   Smokeless tobacco: Never  Building services engineer Use: Never used  Substance Use Topics   Alcohol use: Yes    Comment: occasionally   Drug use: Not Currently     Allergies   Penicillins and Semaglutide   Review of Systems Review of Systems As per HPI  Physical Exam Triage Vital Signs ED Triage Vitals  Enc Vitals Group     BP 04/14/23 1527 110/87     Pulse Rate 04/14/23 1527 80     Resp 04/14/23 1527 16     Temp 04/14/23 1527 98.2 F (36.8 C)     Temp Source 04/14/23 1527 Oral     SpO2 04/14/23 1527 98 %     Weight --      Height --      Head Circumference --      Peak Flow --      Pain Score 04/14/23 1526 0     Pain Loc --      Pain Edu? --      Excl. in [place]? --    No data found.  Updated Vital Signs BP 110/87 (BP Location: Left Arm)   Pulse 80   Temp 98.2 F (36.8 C) (Oral)   Resp 16   LMP 10/04/2018   SpO2 98%   Visual  Acuity Right Eye Distance:   Left Eye Distance:   Bilateral Distance:    Right Eye Near:   Left Eye Near:    Bilateral Near:     Physical Exam Vitals and nursing note reviewed.  Constitutional:      General: She is not in acute distress.    Appearance: She is not ill-appearing.  Cardiovascular:     Rate and Rhythm: Normal rate and regular rhythm.     Pulses: Normal pulses.     Heart sounds: Normal heart sounds.  Pulmonary:     Effort: Pulmonary effort is normal.     Breath sounds: Normal breath sounds.  Musculoskeletal:        General: No swelling or tenderness. Normal range of motion.  Neurological:     Mental Status: She is alert.      UC Treatments / Results  Labs (all labs ordered are listed, but only abnormal results are displayed) Labs Reviewed  URINALYSIS, W/ REFLEX TO CULTURE (INFECTION SUSPECTED) - Abnormal; Notable for the following components:      Result Value   Specific Gravity, Urine >1.030 (*)    Hgb urine dipstick TRACE (*)    Bilirubin  Urine SMALL (*)    Ketones, ur TRACE (*)    Bacteria, UA FEW (*)    All other components within normal limits  CBC WITH DIFFERENTIAL/PLATELET - Abnormal; Notable for the following components:   RBC 5.32 (*)    MCV 76.1 (*)    MCH 24.2 (*)    All other components within normal limits  GLUCOSE, CAPILLARY  COMPREHENSIVE METABOLIC PANEL  TSH  CBG MONITORING, ED    EKG   Radiology No results found.  Procedures Procedures (including critical care time)  Medications Ordered in UC Medications - No data to display  Initial Impression / Assessment and Plan / UC Course  I have reviewed the triage vital signs and the nursing notes.  Pertinent labs & imaging results that were available during my care of the patient were reviewed by me and considered in my medical decision making (see chart for details).     1.  Dysuria: Point-of-care urinalysis is significant for specific gravity of greater than 1.030, ketones, few bacteria and trace hemoglobin. Patient is advised to increase oral fluid intake Return precautions given CBC, CMP.  2.  Numbness of great toes: CBG is 86 TSH Comprehensive metabolic panel Patient felt advised to moderate, decrease alcohol intake. Patient is advised to maintain adequate hydration Return precautions given. Final Clinical Impressions(s) / UC Diagnoses   Final diagnoses:  Dysuria  Numbness of toes     Discharge Instructions      Please increase oral fluid intake Will call you with recommendations if labs are abnormal If you have worsening toe numbness associated with changes in the color of your toes please return to the urgent care to be reevaluated   ED Prescriptions   None    PDMP not reviewed this encounter.   Merrilee Jansky, MD 04/14/23 1640

## 2023-04-14 NOTE — ED Triage Notes (Signed)
Patient presents to UC for left toe numbness since 06/10. States she is also concerned with possible UTI and dehydration. Reports intermittent dysuria, abdominal pressure, urgency.

## 2023-04-14 NOTE — Discharge Instructions (Addendum)
Please increase oral fluid intake Will call you with recommendations if labs are abnormal If you have worsening toe numbness associated with changes in the color of your toes please return to the urgent care to be reevaluated

## 2023-07-31 ENCOUNTER — Other Ambulatory Visit: Payer: Self-pay

## 2023-07-31 ENCOUNTER — Ambulatory Visit
Admission: EM | Admit: 2023-07-31 | Discharge: 2023-07-31 | Disposition: A | Discharge status: Home / Self Care | Payer: BC Managed Care – PPO | Attending: Physician Assistant | Admitting: Physician Assistant

## 2023-07-31 DIAGNOSIS — H60502 Unspecified acute noninfective otitis externa, left ear: Secondary | ICD-10-CM

## 2023-07-31 MED ORDER — CIPROFLOXACIN-DEXAMETHASONE 0.3-0.1 % OT SUSP: 4.0000 [drp] | Freq: Two times a day (BID) | OTIC | 0 refills | Status: AC

## 2023-07-31 NOTE — ED Provider Notes (Signed)
[facility]    CSN: [id number] Arrival date & time: 07/31/23  0845      History   Chief Complaint Chief Complaint  Patient presents with   Otalgia    HPI Jamie Meyers is a 37 y.o. female presenting for left ear pain and swelling of ear canal for the past several days.  Also reports reduced hearing.  Denies fever, fatigue, drainage from ear, cough, congestion, sore throat.  Has used OTC pain relief eardrops without relief.  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      Home Medications    Prior to Admission medications   Medication Sig Start Date End Date Taking? Authorizing Provider  ciprofloxacin-dexamethasone (CIPRODEX) OTIC suspension Place 4 drops into the left ear 2 (two) times daily for 7 days. 07/31/23 08/07/23 Yes Eusebio Friendly B, PA-C  TRI-SPRINTEC 0.18/0.215/0.25 MG-35 MCG tablet Take 1 tablet by mouth daily. 05/01/18   [provider]    Family History Family History  Problem Relation Age of Onset   Hypertension Mother    Hypertension Father    CVA Father    Heart attack Father     Social History Social History   Tobacco Use   Smoking status: Former   Smokeless tobacco: Never  Advertising account planner   Vaping status: Never Used  Substance Use Topics   Alcohol use: Yes    Comment: occasionally   Drug use: Not Currently     Allergies   Penicillins and Semaglutide   Review of Systems Review of Systems   Constitutional:  Negative for chills, diaphoresis, fatigue and fever.  HENT:  Positive for ear pain and hearing loss. Negative for congestion, ear discharge, rhinorrhea, sinus pressure, sinus pain and sore throat.   Respiratory:  Negative for cough.   Gastrointestinal:  Negative for nausea and vomiting.  Musculoskeletal:  Negative for myalgias.  Skin:  Negative for rash.  Neurological:  Negative for weakness and headaches.  Hematological:  Negative for adenopathy.     Physical Exam Triage Vital Signs ED Triage Vitals [07/31/23 0857]  Encounter Vitals Group     BP      Systolic BP Percentile      Diastolic BP Percentile      Pulse      Resp      Temp      Temp src      SpO2      Weight      Height      Head Circumference      Peak Flow      Pain Score 8     Pain Loc      Pain Education      Exclude from Growth Chart    No data found.  Updated Vital Signs BP 132/86   Pulse 93   Temp (!) 97.4 F (36.3 C)   Resp 18   LMP 10/04/2018  SpO2 97%      Physical Exam Vitals and nursing note reviewed.  Constitutional:      General: She is not in acute distress.    Appearance: Normal appearance. She is not ill-appearing or toxic-appearing.  HENT:     Head: Normocephalic and atraumatic.     Right Ear: Tympanic membrane, ear canal and external ear normal.     Left Ear: Tympanic membrane and external ear normal. Swelling (moderate swelling of EAC with slight yellowish exudates) present.     Nose: Nose normal.     Mouth/Throat:     Mouth: Mucous membranes are moist.     Pharynx: Oropharynx is clear.  Eyes:     General: No scleral icterus.       Right eye: No discharge.        Left eye: No discharge.     Conjunctiva/sclera: Conjunctivae normal.  Cardiovascular:     Rate and Rhythm: Normal rate and regular rhythm.  Pulmonary:     Effort: Pulmonary effort is normal. No respiratory distress.  Musculoskeletal:     Cervical back: Neck supple.  Skin:    General: Skin  is dry.  Neurological:     General: No focal deficit present.     Mental Status: She is alert. Mental status is at baseline.     Motor: No weakness.     Gait: Gait normal.  Psychiatric:        Mood and Affect: Mood normal.        Behavior: Behavior normal.        Thought Content: Thought content normal.      UC Treatments / Results  Labs (all labs ordered are listed, but only abnormal results are displayed) Labs Reviewed - No data to display  EKG   Radiology No results found.  Procedures Procedures (including critical care time)  Medications Ordered in UC Medications - No data to display  Initial Impression / Assessment and Plan / UC Course  I have reviewed the triage vital signs and the nursing notes.  Pertinent labs & imaging results that were available during my care of the patient were reviewed by me and considered in my medical decision making (see chart for details).   37 y/o female presents for left sided ear pain and swelling with reduced hearing for a few days. No drainage or fever. On exam has findings consistent with otitis externa. Will treat with ciprodex. Supportive care discussed. Reviewed return precautions.   Final Clinical Impressions(s) / UC Diagnoses   Final diagnoses:  Acute otitis externa of left ear, unspecified type   Discharge Instructions   None    ED Prescriptions     Medication Sig Dispense Auth. Provider   ciprofloxacin-dexamethasone (CIPRODEX) OTIC suspension Place 4 drops into the left ear 2 (two) times daily for 7 days. 7.5 mL Shirlee Latch, PA-C      PDMP not reviewed this encounter.   Shirlee Latch, PA-C 07/31/23 [phone]

## 2023-07-31 NOTE — ED Triage Notes (Signed)
Left ear pain and swelling. Sounds are muffled. Symptoms since Sunday. Denies fever

## 2023-08-02 ENCOUNTER — Ambulatory Visit
Admission: EM | Admit: 2023-08-02 | Discharge: 2023-08-02 | Disposition: A | Discharge status: Home / Self Care | Payer: BC Managed Care – PPO

## 2023-08-02 DIAGNOSIS — H60392 Other infective otitis externa, left ear: Secondary | ICD-10-CM | POA: Diagnosis not present

## 2023-08-02 NOTE — ED Triage Notes (Signed)
Pt c/o stuck cotton swab in L ear that occurred today.

## 2023-08-02 NOTE — ED Provider Notes (Signed)
[facility]    CSN: [id number] Arrival date & time: 08/02/23  1512      History   Chief Complaint Chief Complaint  Patient presents with   Foreign Body in Ear    HPI Jamie Meyers is a 37 y.o. female.   HPI  37 year old female with a past medical history significant for iron deficiency and multiple sclerosis presents for evaluation for possible cotton swab stuck in her left ear.  She is currently under treatment with Ciprodex for an external ear infection and she was using a Q-tip to scratch her ear because it was itching.  When she remove the Q-tip the cotton was missing from the and so she came in for evaluation.  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      Home Medications    Prior to Admission medications   Medication Sig Start Date End Date Taking? Authorizing Provider  busPIRone (BUSPAR) 5 MG tablet Take by mouth. 03/28/23 03/27/24 Yes [provider]  ciprofloxacin-dexamethasone (CIPRODEX) OTIC suspension Place 4 drops into the left ear 2 (two) times daily for 7 days. 07/31/23 08/07/23 Yes Eusebio Friendly B, PA-C  hydrOXYzine (ATARAX) 25 MG tablet Take by mouth. 03/05/23  Yes [provider]  sertraline (ZOLOFT) 100 MG tablet Take 1 tablet by mouth daily. 03/28/23 03/27/24 Yes [provider]  traZODone (DESYREL) 50 MG tablet TAKE 1-2 TABLETS BY MOUTH NIGHTLY AS NEEDED FOR SLEEP. 03/28/23  Yes [provider]  TRI-SPRINTEC 0.18/0.215/0.25 MG-35 MCG tablet Take 1 tablet by mouth daily. 05/01/18  Yes [provider]    Family History Family History  Problem Relation Age of Onset   Hypertension Mother    Hypertension Father    CVA Father    Heart attack Father     Social History Social History   Tobacco Use   Smoking status: Former   Smokeless tobacco: Never  Advertising account planner   Vaping status: Never Used  Substance Use Topics   Alcohol use: Yes    Comment: occasionally   Drug use: Not Currently     Allergies   Penicillins and Semaglutide   Review of Systems Review of Systems  HENT:  Negative for ear discharge and ear pain.      Physical Exam Triage Vital Signs ED Triage Vitals [08/02/23 1519]  Encounter Vitals Group     BP      Systolic BP Percentile      Diastolic BP Percentile      Pulse      Resp 16     Temp      Temp Source Oral     SpO2      Weight      Height      Head Circumference      Peak Flow      Pain Score      Pain Loc      Pain  Education      Exclude from Growth Chart    No data found.  Updated Vital Signs BP 134/82 (BP Location: Right Arm)   Pulse 83   Temp 98.8 F (37.1 C) (Oral)   Resp 16   Ht 5\' 5"  (1.651 m)   Wt 220 lb (99.8 kg)   LMP 10/04/2018   SpO2 96%   BMI 36.61 kg/m   Visual Acuity Right Eye Distance:   Left Eye Distance:   Bilateral Distance:    Right Eye Near:   Left Eye Near:    Bilateral Near:     Physical Exam Vitals and nursing note reviewed.  Constitutional:      Appearance: Normal appearance. She is not ill-appearing.  HENT:     Head: Normocephalic and atraumatic.     Left Ear: External ear normal.     Ears:     Comments: The tympanic membrane is mildly erythematous and the external auditory canal is edematous with erythema.  No appreciable foreign body noted. Skin:    General: Skin is warm and dry.     Capillary Refill: Capillary refill takes less than 2 seconds.  Neurological:     General:  No focal deficit present.     Mental Status: She is alert and oriented to person, place, and time.      UC Treatments / Results  Labs (all labs ordered are listed, but only abnormal results are displayed) Labs Reviewed - No data to display  EKG   Radiology No results found.  Procedures Procedures (including critical care time)  Medications Ordered in UC Medications - No data to display  Initial Impression / Assessment and Plan / UC Course  I have reviewed the triage vital signs and the nursing notes.  Pertinent labs & imaging results that were available during my care of the patient were reviewed by me and considered in my medical decision making (see chart for details).   Patient is a very pleasant, nontoxic-appearing 37 year old female presenting for evaluation for possible foreign body in her left ear.  As mentioned in HPI above, she was using a Q-tip to scratch her ear because of itching and when she removed the Q-tip the cotton ball was missing.  On exam she does have edema to her external auditory canal with some erythema as well as some mild erythema to the tympanic membrane but there is no foreign body in the patient's ear.  I have advised her to continue her Ciprodex as previously prescribed and if she develops any new symptoms to return for reevaluation.   Final Clinical Impressions(s) / UC Diagnoses   Final diagnoses:  Infective otitis externa of left ear     Discharge Instructions      Continue your antibiotic drops as previously prescribed by your PCP.  If you develop any new or worsening symptoms please return for reevaluation.     ED Prescriptions   None    PDMP not reviewed this encounter.   Becky Augusta, NP 08/02/23 [phone]

## 2023-08-02 NOTE — Discharge Instructions (Addendum)
Continue your antibiotic drops as previously prescribed by your PCP.  If you develop any new or worsening symptoms please return for reevaluation.

## 2023-08-07 ENCOUNTER — Other Ambulatory Visit: Payer: Self-pay | Admitting: Ophthalmology

## 2023-08-07 DIAGNOSIS — H4911 Fourth [trochlear] nerve palsy, right eye: Secondary | ICD-10-CM

## 2023-08-07 DIAGNOSIS — G35 Multiple sclerosis: Secondary | ICD-10-CM

## 2023-08-15 ENCOUNTER — Ambulatory Visit
Admission: RE | Admit: 2023-08-15 | Discharge: 2023-08-15 | Disposition: A | Discharge status: Home / Self Care | Payer: BC Managed Care – PPO | Source: Ambulatory Visit | Attending: Ophthalmology | Admitting: Ophthalmology

## 2023-08-15 DIAGNOSIS — H4911 Fourth [trochlear] nerve palsy, right eye: Secondary | ICD-10-CM | POA: Diagnosis present

## 2023-08-15 DIAGNOSIS — G35 Multiple sclerosis: Secondary | ICD-10-CM | POA: Insufficient documentation

## 2023-08-15 MED ORDER — GADOBUTROL 1 MMOL/ML IV SOLN
10.0000 mL | Freq: Once | INTRAVENOUS | Status: AC | PRN
  Administered 2023-08-15: 10 mL via INTRAVENOUS

## 2023-12-17 ENCOUNTER — Ambulatory Visit
Admission: EM | Admit: 2023-12-17 | Discharge: 2023-12-17 | Disposition: A | Discharge status: Home / Self Care | Payer: 59 | Attending: Physician Assistant | Admitting: Physician Assistant

## 2023-12-17 DIAGNOSIS — J069 Acute upper respiratory infection, unspecified: Secondary | ICD-10-CM | POA: Diagnosis present

## 2023-12-17 DIAGNOSIS — R051 Acute cough: Secondary | ICD-10-CM | POA: Insufficient documentation

## 2023-12-17 DIAGNOSIS — J029 Acute pharyngitis, unspecified: Secondary | ICD-10-CM | POA: Insufficient documentation

## 2023-12-17 LAB — RESP PANEL BY RT-PCR (FLU A&B, COVID) ARPGX2
Influenza A by PCR: NEGATIVE
Influenza B by PCR: NEGATIVE
SARS Coronavirus 2 by RT PCR: NEGATIVE

## 2023-12-17 LAB — GROUP A STREP BY PCR: Group A Strep by PCR: NOT DETECTED

## 2023-12-17 MED ORDER — PROMETHAZINE-DM 6.25-15 MG/5ML PO SYRP: 5.0000 mL | ORAL_SOLUTION | Freq: Four times a day (QID) | ORAL | 0 refills | Status: AC | PRN

## 2023-12-17 NOTE — ED Provider Notes (Signed)
 [facility]    CSN: [id number] Arrival date & time: 12/17/23  1024      History   Chief Complaint Chief Complaint  Patient presents with   Cough   Sore Throat   Fever    HPI Jamie Meyers is a 38 y.o. female presenting for fever, fatigue, cough, congestion, sore throat, shortness of breath, and bodyaches x 3 days.  Denies ear pain, sinus pain, chest pain, wheezing, abdominal pain, vomiting or diarrhea.   Patient has been around sick friends. Patient has not been taking over-the-counter meds. No other complaints.  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      Home Medications    Prior to Admission medications   Medication Sig Start Date End Date Taking? Authorizing Provider  KESIMPTA 20 MG/0.4ML SOAJ INJECT 1 PEN UNDER THE SKIN AT EVERY 28 DAYS. TAKE BENADRYL 25 MG AND TYLENOL 650 MG THIRTY MINUTES PRIOR TO STARTING. 09/16/23  Yes [provider]  promethazine-dextromethorphan (PROMETHAZINE-DM) 6.25-15 MG/5ML syrup Take 5 mLs by mouth 4 (four) times daily as needed. 12/17/23  Yes Shirlee Latch, PA-C  busPIRone (BUSPAR) 5 MG tablet Take by mouth. 03/28/23 03/27/24  [provider]  hydrOXYzine (ATARAX) 25 MG tablet Take by mouth. 03/05/23   [provider]  sertraline (ZOLOFT) 100 MG tablet Take 1 tablet by mouth daily. 03/28/23 03/27/24  [provider]  traZODone (DESYREL) 50 MG tablet TAKE 1-2 TABLETS  BY MOUTH NIGHTLY AS NEEDED FOR SLEEP. 03/28/23   [provider]  TRI-SPRINTEC 0.18/0.215/0.25 MG-35 MCG tablet Take 1 tablet by mouth daily. 05/01/18   [provider]    Family History Family History  Problem Relation Age of Onset   Hypertension Mother    Hypertension Father    CVA Father    Heart attack Father     Social History Social History   Tobacco Use   Smoking status: Former   Smokeless tobacco: Never  Advertising account planner   Vaping status: Never Used  Substance Use Topics   Alcohol use: Yes    Comment: occasionally   Drug use: Not Currently     Allergies   Penicillins and Semaglutide   Review of Systems Review of Systems  Constitutional:  Positive for fatigue and fever. Negative for chills and diaphoresis.  HENT:  Positive for rhinorrhea and sore throat. Negative for congestion, ear pain, sinus pressure and sinus pain.   Respiratory:  Positive for cough and shortness of breath.   Cardiovascular:  Negative for chest pain.  Gastrointestinal:  Negative for abdominal pain, nausea and vomiting.  Musculoskeletal:  Positive for myalgias.  Skin:  Negative for rash.  Neurological:  Positive for headaches. Negative for weakness.  Hematological:  Negative for adenopathy.     Physical Exam Triage Vital Signs ED Triage Vitals  Encounter Vitals Group     BP      Systolic BP Percentile  Diastolic BP Percentile      Pulse      Resp      Temp      Temp src      SpO2      Weight      Height      Head Circumference      Peak Flow      Pain Score      Pain Loc      Pain Education      Exclude from Growth Chart    No data found.  Updated Vital Signs BP (!) 158/123 (BP Location: Left Arm)   Pulse (!) 101   Temp 98.5 F (36.9 C) (Oral)   Resp 18   LMP 10/04/2018   SpO2 97%      Physical Exam Vitals and nursing note reviewed.  Constitutional:      General: She is not in acute distress.    Appearance: Normal appearance. She is not  ill-appearing or toxic-appearing.  HENT:     Head: Normocephalic and atraumatic.     Nose: Congestion present.     Mouth/Throat:     Mouth: Mucous membranes are moist.     Pharynx: Oropharynx is clear. Posterior oropharyngeal erythema present.  Eyes:     General: No scleral icterus.       Right eye: No discharge.        Left eye: No discharge.     Conjunctiva/sclera: Conjunctivae normal.  Cardiovascular:     Rate and Rhythm: Normal rate and regular rhythm.     Heart sounds: Normal heart sounds.  Pulmonary:     Effort: Pulmonary effort is normal. No respiratory distress.     Breath sounds: Normal breath sounds.  Musculoskeletal:     Cervical back: Neck supple.  Skin:    General: Skin is dry.  Neurological:     General: No focal deficit present.     Mental Status: She is alert. Mental status is at baseline.     Motor: No weakness.     Gait: Gait normal.  Psychiatric:        Mood and Affect: Mood normal.        Behavior: Behavior normal.      UC Treatments / Results  Labs (all labs ordered are listed, but only abnormal results are displayed) Labs Reviewed  GROUP A STREP BY PCR  RESP PANEL BY RT-PCR (FLU A&B, COVID) [id number]    EKG   Radiology No results found.  Procedures Procedures (including critical care time)  Medications Ordered in UC Medications - No data to display  Initial Impression / Assessment and Plan / UC Course  I have reviewed the triage vital signs and the nursing notes.  Pertinent labs & imaging results that were available during my care of the patient were reviewed by me and considered in my medical decision making (see chart for details).   38 year old female presents for 2-day history of fever, fatigue, cough, congestion, sore throat and shortness of breath.  Patient is afebrile.  Overall well-appearing.  No acute distress.  On exam has nasal congestion and erythema posterior pharynx.  Chest clear.  Heart regular rate and  rhythm.  Respiratory panel and strep test obtained.  All negative.  Discussed obtaining a chest x-ray given complaint of fever, cough and shortness of breath.  Patient denies having a fever since Monday evening.  She also reports her shortness of breath is not that bad and says " I'll live."  We discussed having her return if she does have a return of the fever, worsening cough, increased breathing difficulty, chest pain or weakness.  Viral illness.  Supportive care encouraged with increasing rest and fluids.  Sent Promethazine DM to pharmacy.  Work note given.   Final Clinical Impressions(s) / UC Diagnoses   Final diagnoses:  Viral upper respiratory tract infection  Acute cough  Sore throat     Discharge Instructions      URI/COLD SYMPTOMS: Your exam today is consistent with a viral illness. Antibiotics are not indicated at this time. Use medications as directed, including cough syrup, nasal saline, and decongestants. Your symptoms should improve over the next few days and resolve within a couple of weeks. Increase rest and fluids. F/u if symptoms worsen or predominate such as sore throat, ear pain, productive cough, shortness of breath, or if you develop high fevers or worsening fatigue over the next several days.       ED Prescriptions     Medication Sig Dispense Auth. Provider   promethazine-dextromethorphan (PROMETHAZINE-DM) 6.25-15 MG/5ML syrup Take 5 mLs by mouth 4 (four) times daily as needed. 118 mL Shirlee Latch, PA-C      PDMP not reviewed this encounter.   Shirlee Latch, PA-C 12/17/23 1144

## 2023-12-17 NOTE — ED Triage Notes (Signed)
 Patient presents to UC for cough, fever, SOB, sore throat x 3 days. No OTC meds for symptom relief.

## 2023-12-17 NOTE — Discharge Instructions (Addendum)
 URI/COLD SYMPTOMS: Your exam today is consistent with a viral illness. Antibiotics are not indicated at this time. Use medications as directed, including cough syrup, nasal saline, and decongestants. Your symptoms should improve over the next few days and resolve within a couple of weeks. Increase rest and fluids. F/u if symptoms worsen or predominate such as sore throat, ear pain, productive cough, shortness of breath, or if you develop high fevers or worsening fatigue over the next several days.

## 2024-01-15 ENCOUNTER — Ambulatory Visit
Admission: EM | Admit: 2024-01-15 | Discharge: 2024-01-15 | Disposition: A | Discharge status: Home / Self Care | Attending: Physician Assistant | Admitting: Physician Assistant

## 2024-01-15 DIAGNOSIS — H60391 Other infective otitis externa, right ear: Secondary | ICD-10-CM

## 2024-01-15 MED ORDER — CIPROFLOXACIN-DEXAMETHASONE 0.3-0.1 % OT SUSP: 4.0000 [drp] | Freq: Two times a day (BID) | OTIC | 0 refills | Status: AC

## 2024-01-15 NOTE — ED Triage Notes (Signed)
 Pt states that she recently had left arm paralysis  Pt c/o right side ear infection, jaw swelling and tenderness x5days  Pt states that she is on medication for MS and was told to come and be evaluated   Pt has a left over prescription for a previous ear infection and has been using the ear drops in her ears. Pt states that it helps the left side but not the right

## 2024-01-15 NOTE — ED Provider Notes (Signed)
 [facility]    CSN: [id number] Arrival date & time: 01/15/24  1228      History   Chief Complaint Chief Complaint  Patient presents with   Ear Pain    HPI Jamie Meyers is a 38 y.o. female presenting for right ear pain and swelling of ear canal for the past couple of days. Also reports reduced hearing.  Denies fever, fatigue, drainage from ear, cough, congestion, sore throat.  Has used Ciprodex prescription from 2 years ago for 1 day and also taking Tylenol.   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      Home Medications    Prior to Admission medications   Medication Sig Start Date End Date Taking? Authorizing Provider  ciprofloxacin-dexamethasone (CIPRODEX) OTIC suspension Place 4 drops into the right ear 2 (two) times daily for 7 days. 01/15/24 01/22/24 Yes Harleigh Civello B, PA-C  KESIMPTA 20 MG/0.4ML SOAJ INJECT 1 PEN UNDER THE SKIN AT EVERY 28 DAYS. TAKE BENADRYL 25 MG AND TYLENOL 650 MG THIRTY MINUTES PRIOR TO STARTING. 09/16/23  Yes [provider]  busPIRone (BUSPAR) 5 MG tablet Take by mouth. 03/28/23 03/27/24  [provider]  hydrOXYzine (ATARAX) 25 MG tablet Take by mouth. 03/05/23   [provider]  promethazine-dextromethorphan (PROMETHAZINE-DM) 6.25-15 MG/5ML syrup Take 5 mLs by mouth 4 (four) times daily as needed. 12/17/23   Eusebio Friendly B, PA-C  sertraline (ZOLOFT) 100 MG tablet Take 1 tablet by  mouth daily. 03/28/23 03/27/24  [provider]  traZODone (DESYREL) 50 MG tablet TAKE 1-2 TABLETS BY MOUTH NIGHTLY AS NEEDED FOR SLEEP. 03/28/23   [provider]  TRI-SPRINTEC 0.18/0.215/0.25 MG-35 MCG tablet Take 1 tablet by mouth daily. 05/01/18   [provider]    Family History Family History  Problem Relation Age of Onset   Hypertension Mother    Hypertension Father    CVA Father    Heart attack Father     Social History Social History   Tobacco Use   Smoking status: Former   Smokeless tobacco: Never  Advertising account planner   Vaping status: Never Used  Substance Use Topics   Alcohol use: Yes    Comment: occasionally   Drug use: Not Currently     Allergies   Penicillins and Semaglutide   Review of Systems Review of Systems  Constitutional:  Negative for chills, diaphoresis, fatigue and fever.  HENT:  Positive for ear pain and hearing loss. Negative for congestion, ear discharge, rhinorrhea, sinus pressure, sinus pain and sore throat.   Respiratory:  Negative for cough.   Gastrointestinal:  Negative for nausea and vomiting.  Musculoskeletal:  Negative for myalgias.  Skin:  Negative for rash.  Neurological:  Negative for weakness and headaches.  Hematological:  Negative for adenopathy.     Physical Exam Triage Vital Signs ED Triage Vitals [07/31/23 0857]  Encounter Vitals Group  BP      Systolic BP Percentile      Diastolic BP Percentile      Pulse      Resp      Temp      Temp src      SpO2      Weight      Height      Head Circumference      Peak Flow      Pain Score 8     Pain Loc      Pain Education      Exclude from Growth Chart    No data found.  Updated Vital Signs BP (!) 147/98 (BP Location: Left Arm)   Pulse 72   Temp 98.7 F (37.1 C) (Oral)   Ht 5\' 5"  (1.651 m)   Wt 250 lb (113.4 kg)   LMP 10/04/2018   SpO2 100%   BMI 41.60 kg/m      Physical Exam Vitals and nursing note reviewed.  Constitutional:       General: She is not in acute distress.    Appearance: Normal appearance. She is not ill-appearing or toxic-appearing.  HENT:     Head: Normocephalic and atraumatic.     Right Ear: Tympanic membrane, ear canal and external ear normal. Swelling (moderate swelling of EAC with slight yellowish exudates) present.     Left Ear: Tympanic membrane, ear canal and external ear normal.     Nose: Nose normal.     Mouth/Throat:     Mouth: Mucous membranes are moist.     Pharynx: Oropharynx is clear.  Eyes:     General: No scleral icterus.       Right eye: No discharge.        Left eye: No discharge.     Conjunctiva/sclera: Conjunctivae normal.  Cardiovascular:     Rate and Rhythm: Normal rate and regular rhythm.  Pulmonary:     Effort: Pulmonary effort is normal. No respiratory distress.  Musculoskeletal:     Cervical back: Neck supple.  Skin:    General: Skin is dry.  Neurological:     General: No focal deficit present.     Mental Status: She is alert. Mental status is at baseline.     Motor: No weakness.     Gait: Gait normal.  Psychiatric:        Mood and Affect: Mood normal.        Behavior: Behavior normal.      UC Treatments / Results  Labs (all labs ordered are listed, but only abnormal results are displayed) Labs Reviewed - No data to display  EKG   Radiology No results found.  Procedures Procedures (including critical care time)  Medications Ordered in UC Medications - No data to display  Initial Impression / Assessment and Plan / UC Course  I have reviewed the triage vital signs and the nursing notes.  Pertinent labs & imaging results that were available during my care of the patient were reviewed by me and considered in my medical decision making (see chart for details).   38 y/o female presents for right sided ear pain and swelling with reduced hearing for a few days. No drainage or fever. On exam has findings consistent with otitis externa. Placed ear wick.  Will treat with ciprodex. Supportive care discussed. Reviewed return precautions.   Final Clinical Impressions(s) / UC Diagnoses   Final diagnoses:  Infective otitis externa of right ear   Discharge Instructions  None    ED Prescriptions     Medication Sig Dispense Auth. Provider   ciprofloxacin-dexamethasone (CIPRODEX) OTIC suspension Place 4 drops into the right ear 2 (two) times daily for 7 days. 7.5 mL Shirlee Latch, PA-C      PDMP not reviewed this encounter.       Shirlee Latch, PA-C 01/15/24 [phone]

## 2024-01-17 ENCOUNTER — Ambulatory Visit
Admission: EM | Admit: 2024-01-17 | Discharge: 2024-01-17 | Disposition: A | Discharge status: Home / Self Care | Attending: Emergency Medicine | Admitting: Emergency Medicine

## 2024-01-17 DIAGNOSIS — H60501 Unspecified acute noninfective otitis externa, right ear: Secondary | ICD-10-CM

## 2024-01-17 NOTE — Discharge Instructions (Addendum)
 Continue to use your Ciprodex as previously prescribed for treatment of your otitis externa.  You may apply warm compress to your ear for 20 minutes at a time, 2-3 times a day, to help with pain.  You may take over-the-counter Tylenol and/or ibuprofen as needed for pain.  I have made a stat referral to ear nose and throat for further evaluation and management of your external ear infection.  If the swelling increases, your pain increases, or you develop a fever I recommend that you go to one of the academic medical centers such as [facility] or [facility] to be evaluated by the ENT specialist on-call.

## 2024-01-17 NOTE — ED Triage Notes (Addendum)
 Patient presents to UC for right ear pain x 1 week. Seen 03/20. Prescribed ear drops States pain worse since using the ear drops.

## 2024-01-17 NOTE — ED Provider Notes (Signed)
 [facility]    CSN: [id number] Arrival date & time: 01/17/24  0830      History   Chief Complaint Chief Complaint  Patient presents with   Otalgia    HPI Jamie Meyers is a 38 y.o. female.   HPI  38 year old female with past medical history significant for obesity and MS presents for evaluation of worsening right ear pain.  She reports that she was seen on 01/15/2024 and prescribed Ciprodex for otitis externa.  Ear wick was placed at that time.  She reports that the wick has fallen out.  She reports that the swelling and pain has worsened and she feels like there is fluid in her middle ear.  She also endorses decreased hearing.  She denies any fever or drainage.  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      Home Medications    Prior to Admission medications   Medication Sig Start Date End Date Taking? Authorizing Provider  busPIRone (BUSPAR) 5 MG tablet Take by mouth. 03/28/23 03/27/24  [provider]  ciprofloxacin-dexamethasone (CIPRODEX) OTIC suspension Place 4 drops into the right ear 2 (two) times daily for 7 days. 01/15/24 01/22/24  Eusebio Friendly B, PA-C  hydrOXYzine (ATARAX) 25 MG tablet Take by mouth. 03/05/23   [provider]  KESIMPTA 20 MG/0.4ML SOAJ INJECT 1 PEN UNDER THE SKIN AT EVERY 28 DAYS. TAKE BENADRYL 25 MG AND TYLENOL 650 MG THIRTY MINUTES PRIOR TO STARTING. 09/16/23   [provider]   promethazine-dextromethorphan (PROMETHAZINE-DM) 6.25-15 MG/5ML syrup Take 5 mLs by mouth 4 (four) times daily as needed. 12/17/23   Eusebio Friendly B, PA-C  sertraline (ZOLOFT) 100 MG tablet Take 1 tablet by mouth daily. 03/28/23 03/27/24  [provider]  traZODone (DESYREL) 50 MG tablet TAKE 1-2 TABLETS BY MOUTH NIGHTLY AS NEEDED FOR SLEEP. 03/28/23   [provider]  TRI-SPRINTEC 0.18/0.215/0.25 MG-35 MCG tablet Take 1 tablet by mouth daily. 05/01/18   [provider]    Family History Family History  Problem Relation Age of Onset   Hypertension Mother    Hypertension Father    CVA Father    Heart attack Father     Social History Social History   Tobacco Use   Smoking status: Former   Smokeless tobacco: Never  Advertising account planner   Vaping status: Never Used  Substance Use Topics   Alcohol use: Yes    Comment: occasionally   Drug use: Not Currently     Allergies   Penicillins and Semaglutide   Review of Systems Review of Systems  Constitutional:  Negative for fever.  HENT:  Positive for ear pain and hearing loss. Negative for ear discharge.      Physical Exam Triage Vital Signs ED Triage Vitals [01/17/24 0900]  Encounter Vitals Group     BP      Systolic BP Percentile      Diastolic BP  Percentile      Pulse      Resp      Temp      Temp src      SpO2      Weight      Height      Head Circumference      Peak Flow      Pain Score 9     Pain Loc      Pain Education      Exclude from Growth Chart    No data found.  Updated Vital Signs BP (!) 138/104 (BP Location: Left Arm)   Pulse 80   Temp 97.8 F (36.6 C) (Temporal)   Resp 18   LMP 10/04/2018   SpO2 97%   Visual Acuity Right Eye Distance:   Left Eye Distance:   Bilateral Distance:    Right Eye Near:   Left Eye Near:    Bilateral Near:     Physical Exam Vitals and nursing note reviewed.  Constitutional:      Appearance: Normal appearance. She is not ill-appearing.  HENT:      Head: Normocephalic and atraumatic.     Ears:     Comments: Right external auditory canal is markedly edematous.  Unable to instill a speculum to visualize the distal canal or tympanic membrane. Skin:    General: Skin is warm and dry.     Capillary Refill: Capillary refill takes less than 2 seconds.  Neurological:     General: No focal deficit present.     Mental Status: She is alert and oriented to person, place, and time.      UC Treatments / Results  Labs (all labs ordered are listed, but only abnormal results are displayed) Labs Reviewed - No data to display  EKG   Radiology No results found.  Procedures Procedures (including critical care time)  Medications Ordered in UC Medications - No data to display  Initial Impression / Assessment and Plan / UC Course  I have reviewed the triage vital signs and the nursing notes.  Pertinent labs & imaging results that were available during my care of the patient were reviewed by me and considered in my medical decision making (see chart for details).   Patient is a nontoxic-appearing 38 old female presenting for evaluation of worsening right ear pain as outlined HPI above.  Patient's vital signs are very reassuring as patient is afebrile with an oral temp of 97.8.  Her physical exam does reveal a markedly edematous right external auditory canal.  I am unable to instill a speculum to visualize the distal canal or the tympanic membrane.  No appreciable discharge or erythema noted.  No pain with movement of the auricle of the ear.  I have advised the patient that I can instill a new ear wick to help with delivery of the Ciprodex.  She reports that the previous wick that was in there fell out.  My concern is that the drops may not be making it to the inner ear to resolve the infection.  She reports that she feels like there is fluid in her ear though there is no drainage.  I will make a stat referral to ENT for further evaluation as  given that she is on day 4 of drops and not improving this may actually be a fungal infection.  I have also offered her the option of going to one of the academic medical centers, such as [facility] or [facility], where she  can be evaluated by one of the ENT specialist on-call.  Patient declines at this time.   Final Clinical Impressions(s) / UC Diagnoses   Final diagnoses:  Acute otitis externa of right ear, unspecified type     Discharge Instructions      Continue to use your Ciprodex as previously prescribed for treatment of your otitis externa.  You may apply warm compress to your ear for 20 minutes at a time, 2-3 times a day, to help with pain.  You may take over-the-counter Tylenol and/or ibuprofen as needed for pain.  I have made a stat referral to ear nose and throat for further evaluation and management of your external ear infection.  If the swelling increases, your pain increases, or you develop a fever I recommend that you go to one of the academic medical centers such as [facility] or [facility] to be evaluated by the ENT specialist on-call.     ED Prescriptions   None    PDMP not reviewed this encounter.   Becky Augusta, NP 01/17/24 [phone]
